# Patient Record
Sex: Female | Born: 1995 | Race: Black or African American | Hispanic: No | Marital: Single | Smoking: Never smoker
Health system: Southern US, Community
[De-identification: ages and names within clinical notes are randomized; demographics above are authoritative.]

## PROBLEM LIST (undated history)

## (undated) ENCOUNTER — Inpatient Hospital Stay: Payer: Self-pay

## (undated) DIAGNOSIS — W3400XA Accidental discharge from unspecified firearms or gun, initial encounter: Secondary | ICD-10-CM

## (undated) DIAGNOSIS — F419 Anxiety disorder, unspecified: Secondary | ICD-10-CM

## (undated) HISTORY — PX: OTHER SURGICAL HISTORY: SHX169

---
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
## 2015-07-07 ENCOUNTER — Encounter: Payer: Self-pay | Admitting: Family Medicine

## 2015-07-07 ENCOUNTER — Emergency Department
Admission: EM | Admit: 2015-07-07 | Discharge: 2015-07-07 | Disposition: A | Discharge status: Home / Self Care | Payer: Medicaid Other | Attending: Emergency Medicine | Admitting: Emergency Medicine

## 2015-07-07 DIAGNOSIS — Z3A00 Weeks of gestation of pregnancy not specified: Secondary | ICD-10-CM | POA: Insufficient documentation

## 2015-07-07 DIAGNOSIS — O9989 Other specified diseases and conditions complicating pregnancy, childbirth and the puerperium: Secondary | ICD-10-CM | POA: Diagnosis present

## 2015-07-07 DIAGNOSIS — H1132 Conjunctival hemorrhage, left eye: Secondary | ICD-10-CM | POA: Diagnosis not present

## 2015-07-07 DIAGNOSIS — J309 Allergic rhinitis, unspecified: Secondary | ICD-10-CM | POA: Diagnosis not present

## 2015-07-07 DIAGNOSIS — Z3401 Encounter for supervision of normal first pregnancy, first trimester: Secondary | ICD-10-CM

## 2015-07-07 DIAGNOSIS — O99519 Diseases of the respiratory system complicating pregnancy, unspecified trimester: Secondary | ICD-10-CM | POA: Insufficient documentation

## 2015-07-07 DIAGNOSIS — Z79899 Other long term (current) drug therapy: Secondary | ICD-10-CM | POA: Diagnosis not present

## 2015-07-07 DIAGNOSIS — J302 Other seasonal allergic rhinitis: Secondary | ICD-10-CM | POA: Diagnosis present

## 2015-07-07 LAB — POCT PREGNANCY, URINE: PREG TEST UR: POSITIVE — AB

## 2015-07-07 NOTE — ED Notes (Signed)
Left eye blood vessel rupture, denies pain at this time.

## 2015-07-07 NOTE — ED Provider Notes (Signed)
[facility] Emergency Department Provider Note ____________________________________________  Time seen: Approximately 4:56 PM  I have reviewed the triage vital signs and the nursing notes.   HISTORY  Chief Complaint Eye Problem   HPI Nancy Dixon is a 19 y.o. female who presents to emergency department for evaluation of left eye redness. She states that she noticed it last Monday or Tuesday. She denies a change in vision. She denies pain in the eye. She denies injury to the eye. She denies discharge. She denies recent URI. She states that she has had some issues with seasonal allergies. She is taking Zyrtec about every 2 weeks.  Patient is also requesting a pregnancy test. She has very irregular menses, however she states it's been 3-4 months since she's had a menstrual cycle.  History reviewed. No pertinent past medical history.  Patient Active Problem List   Diagnosis Date Noted  . Seasonal allergies 07/07/2015    History reviewed. No pertinent past surgical history.  Current Outpatient Rx  Name  Route  Sig  Dispense  Refill  . cetirizine (ZYRTEC) 10 MG tablet   Oral   Take 10 mg by mouth daily.           Allergies Review of patient's allergies indicates no known allergies.  History reviewed. No pertinent family history.  Social History Social History  Substance Use Topics  . Smoking status: Never Smoker   . Smokeless tobacco: Never Used  . Alcohol Use: No    Review of Systems   Constitutional: No fever/chills Eyes: Visual changes: no. ENT: No sore throat. Cardiovascular: Denies chest pain. Respiratory: Denies shortness of breath. Gastrointestinal: No abdominal pain.  No nausea, no vomiting.  No diarrhea.  No constipation. Musculoskeletal: Negative for pain. Skin: Negative for rash. Neurological: Negative for headaches, focal weakness or numbness. Psychiatric:At baseline, no complaint Lymphatic:Swollen nodes-- no Allergic:  Seasonal allergies: yes 10-point ROS otherwise negative.  ____________________________________________  PHYSICAL EXAM:  VITAL SIGNS: BP 131/65; HR 87; Temp: 97.8; Resp: 16; SpO2 97%. ED Triage Vitals  Enc Vitals Group     BP --      Pulse --      Resp --      Temp --      Temp src --      SpO2 --      Weight --      Height --      Head Cir --      Peak Flow --      Pain Score --      Pain Loc --      Pain Edu? --      Excl. in [place]? --     Constitutional: Alert and oriented. Well appearing and in no acute distress. Eyes: Visual acuity--see nursing documentation; No globe trauma; Eyelids normal to inspection; Everted for exam yes; Conjunctiva and sclera: subconjunctival hemorrhage noted to the 3 o'clock area of the left eye; Corneas: Normal; Examined with fluorescein no; EOM's intact; Pupils PERRLA; Anterior Chambers normal with limited exam.  Head: Atraumatic. Nose: No congestion/rhinnorhea. Mouth/Throat: Mucous membranes are moist.  Oropharynx non-erythematous. Neck: No stridor.  Cardiovascular: Normal rate, regular rhythm. Grossly normal heart sounds.  Good peripheral circulation. Respiratory: Normal respiratory effort.  No retractions. Gastrointestinal: Soft and nontender. No distention. No abdominal bruits. No CVA tenderness. Musculoskeletal:Normal ROM Neurologic:  Normal speech and language. No gross focal neurologic deficits are appreciated. Speech is normal. No gait instability. Skin:  Skin is warm, dry and  intact. No rash noted. Psychiatric: Mood and affect are normal. Speech and behavior are normal.  ____________________________________________   LABS (all labs ordered are listed, but only abnormal results are displayed)  Labs Reviewed  POCT PREGNANCY, URINE - Abnormal; Notable for the following:    Preg Test, Ur POSITIVE (*)    All other components within normal limits  POC URINE PREG, ED    ____________________________________________  EKG   ____________________________________________  RADIOLOGY   ____________________________________________   PROCEDURES  Procedure(s) performed:   ____________________________________________   INITIAL IMPRESSION / ASSESSMENT AND PLAN / ED COURSE  Pertinent labs & imaging results that were available during my care of the patient were reviewed by me and considered in my medical decision making (see chart for details).  Patient advised to schedule an appointment with an OB GYN.  Patient was advised to follow up with opthalmology for eye symptoms that are not improving over the next 14 days. She was  also advised to return to the ER for symptoms that change or worsen if unable to schedule an appointment.  ____________________________________________   FINAL CLINICAL IMPRESSION(S) / ED DIAGNOSES  Final diagnoses:  Subconjunctival bleed, left  Pregnancy, first, first trimester       Victorino Dike, FNP 07/07/15 1918  Daymon Larsen, MD 07/07/15 2006

## 2015-08-05 ENCOUNTER — Other Ambulatory Visit: Payer: Self-pay | Admitting: Physician Assistant

## 2015-08-05 DIAGNOSIS — Z3492 Encounter for supervision of normal pregnancy, unspecified, second trimester: Secondary | ICD-10-CM

## 2015-08-06 ENCOUNTER — Ambulatory Visit
Admission: RE | Admit: 2015-08-06 | Discharge: 2015-08-06 | Disposition: A | Discharge status: Home / Self Care | Payer: Medicaid Other | Source: Ambulatory Visit | Attending: Physician Assistant | Admitting: Physician Assistant

## 2015-08-06 DIAGNOSIS — Z3A29 29 weeks gestation of pregnancy: Secondary | ICD-10-CM | POA: Insufficient documentation

## 2015-08-06 DIAGNOSIS — Z36 Encounter for antenatal screening of mother: Secondary | ICD-10-CM | POA: Diagnosis present

## 2015-08-06 DIAGNOSIS — Z3492 Encounter for supervision of normal pregnancy, unspecified, second trimester: Secondary | ICD-10-CM

## 2015-08-27 ENCOUNTER — Inpatient Hospital Stay
Admission: RE | Admit: 2015-08-27 | Discharge: 2015-08-27 | Disposition: A | Discharge status: Home / Self Care | Payer: Medicaid Other | Attending: Obstetrics and Gynecology | Admitting: Obstetrics and Gynecology

## 2015-08-27 ENCOUNTER — Encounter: Payer: Self-pay | Admitting: *Deleted

## 2015-08-27 DIAGNOSIS — Z3A32 32 weeks gestation of pregnancy: Secondary | ICD-10-CM | POA: Diagnosis not present

## 2015-08-27 DIAGNOSIS — O26893 Other specified pregnancy related conditions, third trimester: Secondary | ICD-10-CM | POA: Diagnosis present

## 2015-08-27 NOTE — Procedures (Signed)
FETAL SURVEILLANCE TESTING SUMMARY  INDICATIONS: 1) supervision of high risk pregnancy, 3rd trimester 2) morbid obesity affecting pregnancy, 3rd trimester 3) BMI 45 - 49.89 4) [redacted] weeks gestational age  OBJECTIVE RESULTS: Fetal heart variability: moderate Fetal Heart Rate decelerations: none Fetal heart rate accelerations: present (multiple 15 x 15 bpm changes from baseline lasting > 15 seconds) Baseline fetal heart rate: 145 bpm Tocometry: occasional contractions, not regular or frequent  Fetal surveillance: reassuring  Prentice Docker, MD 08/27/2015 10:09 AM

## 2015-09-03 ENCOUNTER — Inpatient Hospital Stay
Admission: RE | Admit: 2015-09-03 | Discharge: 2015-09-03 | Disposition: A | Discharge status: Home / Self Care | Payer: Medicaid Other | Attending: Obstetrics and Gynecology | Admitting: Obstetrics and Gynecology

## 2015-09-03 DIAGNOSIS — Z3A33 33 weeks gestation of pregnancy: Secondary | ICD-10-CM | POA: Insufficient documentation

## 2015-09-03 DIAGNOSIS — E669 Obesity, unspecified: Secondary | ICD-10-CM | POA: Diagnosis present

## 2015-09-03 DIAGNOSIS — O99213 Obesity complicating pregnancy, third trimester: Secondary | ICD-10-CM | POA: Insufficient documentation

## 2015-09-03 NOTE — Progress Notes (Signed)
OB Triage Note  D/w patient that given BMI 51 that I believe that her care is better served at a tertiary care center and since she goes to [facility], I recommended [facility], which she is amenable to. Information relayed to HD and patient has PNV next week and was told to let them know that she desires switching her delivery site to [facility] rNST today  Durene Romans MD [place]  Pager: [phone]

## 2015-09-03 NOTE — Procedures (Signed)
Fetal Non Stress Test  Current Date/Time: 09/03/2015/10:02 AM Gestational Age: 19 1/7 FHT: baseline 150, positive accelerations to 170, no decelerations, moderate variability Toco: quiet  A/P: pt doing well Reactive NST. Continue current plan of care. Discussed with patient given her BMI recommendation of delivery at tertiary facility- [facility], [facility]   This patient and plan were discussed with Dr Ilda Basset 09/03/2015

## 2015-09-10 ENCOUNTER — Inpatient Hospital Stay
Admission: RE | Admit: 2015-09-10 | Discharge: 2015-09-10 | Disposition: A | Discharge status: Home / Self Care | Payer: Medicaid Other | Attending: Obstetrics and Gynecology | Admitting: Obstetrics and Gynecology

## 2015-09-10 DIAGNOSIS — O99213 Obesity complicating pregnancy, third trimester: Secondary | ICD-10-CM | POA: Insufficient documentation

## 2015-09-10 DIAGNOSIS — Z3A34 34 weeks gestation of pregnancy: Secondary | ICD-10-CM | POA: Insufficient documentation

## 2015-09-10 DIAGNOSIS — O0973 Supervision of high risk pregnancy due to social problems, third trimester: Secondary | ICD-10-CM | POA: Diagnosis not present

## 2015-09-10 DIAGNOSIS — Z6841 Body Mass Index (BMI) 40.0 and over, adult: Secondary | ICD-10-CM | POA: Diagnosis not present

## 2015-09-10 NOTE — Procedures (Deleted)
Fetal Non Stress Test  Current Date/Time: 09/10/2015/9:35 AM Gestational Age: [redacted]w[redacted]d FHT: 145 baseline, +accels, no decels, moderate variability Toco: quiet  A/P: pt doing well RNST. Continue with current plan of care. Per patient, next AP testing will done at [place] Pager: [phone]

## 2015-09-10 NOTE — Procedures (Signed)
Fetal Non Stress Test  Current Date/Time: 09/10/2015/9:35 AM Gestational Age: [redacted]w[redacted]d FHT: 145 baseline, +accels, no decels, moderate variability Toco: quiet  A/P: pt doing well RNST. Continue with current plan of care. Per patient, next AP testing will done at [place] Pager: [phone]

## 2015-12-01 ENCOUNTER — Emergency Department
Admission: EM | Admit: 2015-12-01 | Discharge: 2015-12-01 | Disposition: A | Discharge status: Home / Self Care | Payer: Medicaid Other | Attending: Emergency Medicine | Admitting: Emergency Medicine

## 2015-12-01 ENCOUNTER — Encounter: Payer: Self-pay | Admitting: *Deleted

## 2015-12-01 DIAGNOSIS — Z79899 Other long term (current) drug therapy: Secondary | ICD-10-CM | POA: Diagnosis not present

## 2015-12-01 DIAGNOSIS — Y999 Unspecified external cause status: Secondary | ICD-10-CM | POA: Insufficient documentation

## 2015-12-01 DIAGNOSIS — S01111A Laceration without foreign body of right eyelid and periocular area, initial encounter: Secondary | ICD-10-CM | POA: Diagnosis not present

## 2015-12-01 DIAGNOSIS — Y929 Unspecified place or not applicable: Secondary | ICD-10-CM | POA: Diagnosis not present

## 2015-12-01 DIAGNOSIS — S0083XA Contusion of other part of head, initial encounter: Secondary | ICD-10-CM

## 2015-12-01 DIAGNOSIS — Y939 Activity, unspecified: Secondary | ICD-10-CM | POA: Diagnosis not present

## 2015-12-01 DIAGNOSIS — S0181XA Laceration without foreign body of other part of head, initial encounter: Secondary | ICD-10-CM

## 2015-12-01 DIAGNOSIS — W228XXA Striking against or struck by other objects, initial encounter: Secondary | ICD-10-CM | POA: Insufficient documentation

## 2015-12-01 NOTE — ED Notes (Signed)
Pt with lac over right eye. Not bleeding at this time. Small amount eye lid swelling. Eye appears undamaged. Pt denies vision changes.

## 2015-12-01 NOTE — ED Notes (Signed)
Pt hit right side of eye on dresser, pt has small laceration , no bleeding noted

## 2015-12-01 NOTE — ED Provider Notes (Signed)
[facility] Emergency Department Provider Note ____________________________________________  Time seen: 19:30  I have reviewed the triage vital signs and the nursing notes.  HISTORY  Chief Complaint  Laceration  HPI Nancy Dixon is a 20 y.o. female who presents for a head laceration. The patient bent over and hit her head on the corner of a car door. The laceration is located under the right eyebrow. She denies loss of consciousness, dizziness, nausea, vomiting, lightheadedness, or altered mental status. She had a mild headache, but that has resolved. She applied pressure to stop the bleeding. She denies any significant pain related to her injury.  History reviewed. No pertinent past medical history.  Patient Active Problem List   Diagnosis Date Noted  . Seasonal allergies 07/07/2015    History reviewed. No pertinent past surgical history.  Current Outpatient Rx  Name  Route  Sig  Dispense  Refill  . cetirizine (ZYRTEC) 10 MG tablet   Oral   Take 10 mg by mouth daily.         . Prenatal Vit-Fe Fumarate-FA (MULTIVITAMIN-PRENATAL) 27-0.8 MG TABS tablet   Oral   Take 1 tablet by mouth daily at 12 noon.           Allergies Banana  No family history on file.  Social History Social History  Substance Use Topics  . Smoking status: Never Smoker   . Smokeless tobacco: Never Used  . Alcohol Use: No    Review of Systems  Constitutional: Negative for fever. Eyes: Negative for visual changes. Skin: Positive for laceration below right eyebrow. Neurological: Positive for headaches. Negative focal weakness or numbness. ____________________________________________  PHYSICAL EXAM:  VITAL SIGNS: ED Triage Vitals  Enc Vitals Group     BP 12/01/15 1845 124/86 mmHg     Pulse Rate 12/01/15 1845 89     Resp 12/01/15 1845 20     Temp 12/01/15 1845 97.3 F (36.3 C)     Temp Source 12/01/15 1845 Oral     SpO2 12/01/15 1845 98 %     Weight  12/01/15 1845 290 lb (131.543 kg)     Height 12/01/15 1845 5\' 3"  (1.6 m)     Head Cir --      Peak Flow --      Pain Score --      Pain Loc --      Pain Edu? --      Excl. in [place]? --     Constitutional: Alert and oriented. Well appearing and in no distress. Head: Normocephalic. Patient with a 1 cm laceration beneath the right eyebrow. No longer bleeding. Minimal swelling.  Eyes: Conjunctivae are normal. PERRL.  Neurologic:  Normal gait without ataxia. Normal speech and language. No gross focal neurologic deficits are appreciated. Skin:  Skin is warm, dry. No rash noted. Psychiatric: Mood and affect are normal. Patient exhibits appropriate insight and judgment. ____________________________________________  PROCEDURES  LACERATION REPAIR Performed by: Loni Muse PA-S Authorized by: Melvenia Needles Consent: Verbal consent obtained. Risks and benefits: risks, benefits and alternatives were discussed Consent given by: patient Patient identity confirmed: provided demographic data Wound explored  Laceration Location: Beneath right eyebrow  Laceration Length: 1 cm  No Foreign Bodies seen or palpated  Irrigation method: damp gauze   Amount of cleaning: standard  Skin closure: Wound adhesive  Patient tolerance: Patient tolerated the procedure well with no immediate complications. ____________________________________________  INITIAL IMPRESSION / ASSESSMENT AND PLAN / ED COURSE  Patient with  1 cm laceration beneath right eyebrow. Wound adhesive applied. Patient was advised to not apply lotion, ointment, or topical antibiotics to the area. She will follow up with her PCP as scheduled in one week. She will return to the ED if she experiences any neurological symptoms.  ____________________________________________  FINAL CLINICAL IMPRESSION(S) / ED DIAGNOSES  Final diagnoses:  Facial laceration, initial encounter  Facial contusion, initial encounter     Melvenia Needles, PA-C 12/01/15 2044  Ponciano Ort, MD 12/02/15 0030

## 2015-12-01 NOTE — Discharge Instructions (Signed)
Facial Laceration A facial laceration is a cut on the face. These injuries can be painful and cause bleeding. Some cuts may need to be closed with stitches (sutures), skin adhesive strips, or wound glue. Cuts usually heal quickly but can leave a scar. It can take 1-2 years for the scar to go away completely. HOME CARE   Only take medicines as told by your doctor.  Follow your doctor's instructions for wound care. For Stitches:  Keep the cut clean and dry.  If you have a bandage (dressing), change it at least once a day. Change the bandage if it gets wet or dirty, or as told by your doctor.  Wash the cut with soap and water 2 times a day. Rinse the cut with water. Pat it dry with a clean towel.  Put a thin layer of medicated cream on the cut as told by your doctor.  You may shower after the first 24 hours. Do not soak the cut in water until the stitches are removed.  Have your stitches removed as told by your doctor.  Do not wear any makeup until a few days after your stitches are removed. For Skin Adhesive Strips:  Keep the cut clean and dry.  Do not get the strips wet. You may take a bath, but be careful to keep the cut dry.  If the cut gets wet, pat it dry with a clean towel.  The strips will fall off on their own. Do not remove the strips that are still stuck to the cut. For Wound Glue:  You may shower or take baths. Do not soak or scrub the cut. Do not swim. Avoid heavy sweating until the glue falls off on its own. After a shower or bath, pat the cut dry with a clean towel.  Do not put medicine or makeup on your cut until the glue falls off.  If you have a bandage, do not put tape over the glue.  Avoid lots of sunlight or tanning lamps until the glue falls off.  The glue will fall off on its own in 5-10 days. Do not pick at the glue. After Healing:  Put sunscreen on the cut for the first year to reduce your scar. GET HELP IF:  You have a fever. GET HELP RIGHT AWAY  IF:   Your cut area gets red, painful, or puffy (swollen).  You see a yellowish-white fluid (pus) coming from the cut.   This information is not intended to replace advice given to you by your health care provider. Make sure you discuss any questions you have with your health care provider.   Document Released: 02/21/2008 Document Revised: 09/25/2014 Document Reviewed: 04/17/2013 Elsevier Interactive Patient Education 2016 [place], or Adhesive Wound Closure Doctors use stitches (sutures), staples, and certain glue (skin adhesives) to hold your skin together while it heals (wound closure). You may need this treatment after you have surgery or if you cut your skin accidentally. These methods help your skin heal more quickly. They also make it less likely that you will have a scar. WHAT ARE THE DIFFERENT KINDS OF WOUND CLOSURES? There are many options for wound closure. The one that your doctor uses depends on how deep and large your wound is. Adhesive Glue To use this glue to close a wound, your doctor holds the edges of the wound together and paints the glue on the surface of your skin. You may need more than one layer  of glue. Then the wound may be covered with a light bandage (dressing). This type of skin closure may be used for small wounds that are not deep (superficial). Using glue for wound closure is less painful than other methods. It does not require a medicine that numbs the area. This method also leaves nothing to be removed. Adhesive glue is often used for children and on facial wounds. Adhesive glue cannot be used for wounds that are deep, uneven, or bleeding. It is not used inside of a wound.  Adhesive Strips These strips are made of sticky (adhesive), porous paper. They are placed across your skin edges like a regular adhesive bandage. You leave them on until they fall off. Adhesive strips may be used to close very superficial wounds. They may also be  used along with sutures to improve closure of your skin edges.  Sutures Sutures are the oldest method of wound closure. Sutures can be made from natural or synthetic materials. They can be made from a material that your body can break down as your wound heals (absorbable), or they can be made from a material that needs to be removed from your skin (nonabsorbable). They come in many different strengths and sizes. Your doctor attaches the sutures to a steel needle on one end. Sutures can be passed through your skin, or through the tissues beneath your skin. Then they are tied and cut. Your skin edges may be closed in one continuous stitch or in separate stitches. Sutures are strong and can be used for all kinds of wounds. Absorbable sutures may be used to close tissues under the skin. The disadvantage of sutures is that they may cause skin reactions that lead to infection. Nonabsorbable sutures need to be removed. Staples When surgical staples are used to close a wound, the edges of your skin on both sides of the wound are brought close together. A staple is placed across the wound, and an instrument secures the edges together. Staples are often used to close surgical cuts (incisions). Staples are faster to use than sutures, and they cause less reaction from your skin. Staples need to be removed using a tool that bends the staples away from your skin. HOW DO I CARE FOR MY WOUND CLOSURE?  Take medicines only as told by your doctor.  If you were prescribed an antibiotic medicine for your wound, finish it all even if you start to feel better.  Use ointments or creams only as told by your doctor.  Wash your hands with soap and water before and after touching your wound.  Do not soak your wound in water. Do not take baths, swim, or use a hot tub until your doctor says it is okay.  Ask your doctor when you can start showering. Cover your wound if told by your doctor.  Do not take out your own sutures  or staples.  Do not pick at your wound. Picking can cause an infection.  Keep all follow-up visits as told by your doctor. This is important. HOW LONG WILL I HAVE MY WOUND CLOSURE?   Leave adhesive glue on your skin until the glue peels away.  Leave adhesive strips on your skin until they fall off.  Absorbable sutures will dissolve within several days.  Nonabsorbable sutures and staples must be removed. The location of the wound will determine how long they stay in. This can range from several days to a couple of weeks. WHEN SHOULD I SEEK HELP FOR MY WOUND CLOSURE?  Contact your doctor if:  You have a fever.  You have chills.  You have redness, puffiness (swelling), or pain at the site of your wound.  You have fluid, blood, or pus coming from your wound.  There is a bad smell coming from your wound.  The skin edges of your wound start to separate after your sutures have been removed.  Your wound becomes thick, raised, and darker in color after your sutures come out (scarring).   This information is not intended to replace advice given to you by your health care provider. Make sure you discuss any questions you have with your health care provider.   Document Released: 07/02/2009 Document Revised: 09/25/2014 Document Reviewed: 02/11/2014 Elsevier Interactive Patient Education 2016 [place] not apply lotion or ointment to the glued area, as this will cause the material to breakdown. Follow up with your PCP as scheduled. Return to the ED if you experience worsening of symptoms or neurological symptoms, such as dizziness, altered mental status, or loss of consciousness.

## 2016-06-27 DIAGNOSIS — S45102A Unspecified injury of brachial artery, left side, initial encounter: Secondary | ICD-10-CM | POA: Insufficient documentation

## 2016-07-24 ENCOUNTER — Emergency Department
Admission: EM | Admit: 2016-07-24 | Discharge: 2016-07-24 | Disposition: A | Discharge status: Home / Self Care | Payer: Medicaid Other | Attending: Emergency Medicine | Admitting: Emergency Medicine

## 2016-07-24 DIAGNOSIS — T43615A Adverse effect of caffeine, initial encounter: Secondary | ICD-10-CM | POA: Diagnosis not present

## 2016-07-24 DIAGNOSIS — T887XXA Unspecified adverse effect of drug or medicament, initial encounter: Secondary | ICD-10-CM | POA: Diagnosis not present

## 2016-07-24 DIAGNOSIS — R450 Nervousness: Secondary | ICD-10-CM | POA: Diagnosis present

## 2016-07-24 DIAGNOSIS — Y828 Other medical devices associated with adverse incidents: Secondary | ICD-10-CM | POA: Insufficient documentation

## 2016-07-24 NOTE — ED Triage Notes (Signed)
Patient presents to the ED with feeling "jittery" and nauseous after drinking two cups of coffee today at [facility].  Patient states, "I never drink coffee."  Patient is in no obvious distress at this time.  Patient denies abdominal pain.

## 2016-07-24 NOTE — ED Provider Notes (Signed)
[facility] Emergency Department Provider Note  ____________________________________________  Time seen: Approximately 5:49 PM  I have reviewed the triage vital signs and the nursing notes.   HISTORY  Chief Complaint "jittery"    HPI Nancy Dixon is a 20 y.o. female [facility] emergency department complaining of feeling jittery. Patient states that at lunch today she drank approximately 32 ounces of coffee. She states that she never drinks any caffeinated beverages. Initially, patient states that she had no symptoms. However she started feeling "jittery" "shaky, lightheaded, and had one episode of looser stools. Patient denies any chest pain, shortness of breath, palpitations. Patient states that symptoms are improving somewhat. Patient denies any other complaints at this time. No significant medical history.   History reviewed. No pertinent past medical history.  Patient Active Problem List   Diagnosis Date Noted  . Seasonal allergies 07/07/2015    History reviewed. No pertinent surgical history.  Prior to Admission medications   Medication Sig Start Date End Date Taking? Authorizing Provider  cetirizine (ZYRTEC) 10 MG tablet Take 10 mg by mouth daily.    Historical Provider, MD  Prenatal Vit-Fe Fumarate-FA (MULTIVITAMIN-PRENATAL) 27-0.8 MG TABS tablet Take 1 tablet by mouth daily at 12 noon.    Historical Provider, MD    Allergies Banana  No family history on file.  Social History Social History  Substance Use Topics  . Smoking status: Never Smoker  . Smokeless tobacco: Never Used  . Alcohol use No     Review of Systems  Constitutional: No fever/chills. Positive for feeling "jittery" Eyes: No visual changes.  ENT: No upper respiratory complaints. Cardiovascular: no chest pain. Respiratory: no cough. No SOB. Gastrointestinal: No abdominal pain.  No nausea, no vomiting.  No diarrhea but positive for looser stools..  No  constipation. Musculoskeletal: Negative for musculoskeletal pain. Skin: Negative for rash, abrasions, lacerations, ecchymosis. Neurological: Negative for headaches, focal weakness or numbness. 10-point ROS otherwise negative.  ____________________________________________   PHYSICAL EXAM:  VITAL SIGNS: ED Triage Vitals  Enc Vitals Group     BP 07/24/16 1702 123/82     Pulse Rate 07/24/16 1702 89     Resp 07/24/16 1702 18     Temp 07/24/16 1702 98.2 F (36.8 C)     Temp Source 07/24/16 1702 Oral     SpO2 07/24/16 1702 99 %     Weight 07/24/16 1702 276 lb (125.2 kg)     Height 07/24/16 1702 5\' 2"  (1.575 m)     Head Circumference --      Peak Flow --      Pain Score 07/24/16 1724 0     Pain Loc --      Pain Edu? --      Excl. in [place]? --      Constitutional: Alert and oriented. Well appearing and in no acute distress. Eyes: Conjunctivae are normal. PERRL. EOMI. Head: Atraumatic. Neck: No stridor.    Cardiovascular: Normal rate, regular rhythm. Normal S1 and S2.  Good peripheral circulation. Respiratory: Normal respiratory effort without tachypnea or retractions. Lungs CTAB. Good air entry to the bases with no decreased or absent breath sounds. Musculoskeletal: Full range of motion to all extremities. No gross deformities appreciated. Neurologic:  Normal speech and language. No gross focal neurologic deficits are appreciated.  Skin:  Skin is warm, dry and intact. No rash noted. Psychiatric: Mood and affect are normal. Speech and behavior are normal. Patient exhibits appropriate insight and judgement.   ____________________________________________   LABS (  all labs ordered are listed, but only abnormal results are displayed)  Labs Reviewed - No data to display ____________________________________________  EKG   ____________________________________________  RADIOLOGY   No results found.  ____________________________________________    PROCEDURES  Procedure(s)  performed:    Procedures    Medications - No data to display   ____________________________________________   INITIAL IMPRESSION / ASSESSMENT AND PLAN / ED COURSE  Pertinent labs & imaging results that were available during my care of the patient were reviewed by me and considered in my medical decision making (see chart for details).  Review of the [place] CSRS was performed in accordance of the [place] prior to dispensing any controlled drugs.  Clinical Course     Patient's diagnosis is consistent with Side effects of caffeine use. Patient reports never drinking caffeine and had approximately 32 ounces of coffee within a short duration. Patient's symptoms are consistent with caffeine use. Exam is reassuring. Patient reports that symptoms are improving at this time..  Patient is given ED precautions to return to the ED for any worsening or new symptoms.     ____________________________________________  FINAL CLINICAL IMPRESSION(S) / ED DIAGNOSES  Final diagnoses:  Adverse effect of caffeine, initial encounter      NEW MEDICATIONS STARTED DURING THIS VISIT:  Discharge Medication List as of 07/24/2016  6:49 PM          This chart was dictated using voice recognition software/Dragon. Despite best efforts to proofread, errors can occur which can change the meaning. Any change was purely unintentional.    Darletta Moll, PA-C 07/24/16 1907    Lisa Roca, MD 07/27/16 1024

## 2016-07-24 NOTE — ED Notes (Signed)
Pt reports she was eating dinner and began to feel sick approx one hour later - pt became "jittery", cold/clammy, shaky, "swimmy headed" and  loose stools - pt mother checked her sugar and it was 150 - pt reports that at this time she is feeling fine

## 2016-09-27 DIAGNOSIS — E669 Obesity, unspecified: Secondary | ICD-10-CM | POA: Insufficient documentation

## 2016-10-18 ENCOUNTER — Emergency Department
Admission: EM | Admit: 2016-10-18 | Discharge: 2016-10-18 | Disposition: A | Discharge status: Home / Self Care | Payer: Medicaid Other | Attending: Emergency Medicine | Admitting: Emergency Medicine

## 2016-10-18 ENCOUNTER — Encounter: Payer: Self-pay | Admitting: *Deleted

## 2016-10-18 DIAGNOSIS — F419 Anxiety disorder, unspecified: Secondary | ICD-10-CM | POA: Insufficient documentation

## 2016-10-18 DIAGNOSIS — F410 Panic disorder [episodic paroxysmal anxiety] without agoraphobia: Secondary | ICD-10-CM
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
## 2016-10-18 NOTE — ED Triage Notes (Addendum)
States hx of anxiety attack, states she was at work and she had a panic attack and felt her heart beating fast, at present pt awake and alert, states feeling better at present, states she has taken her mother medication previously when she had an axiety attack

## 2016-10-18 NOTE — Discharge Instructions (Signed)
Please follow-up with your doctor. Please also call your surgeon. But them know about the pain your having when you usually arm a lot. I think you're having what is basically claudication of the arm in other words the muscles or not getting enough blood flow when you're using it a lot and they start to ache and then get better when he rests. He can avoid this by not using the arm a lot or possibly the surgeons might be able to open the narrowed area that they told you that you had. At the present time I don't think there is anything to worry about. If she continued to have more panic attacks her doctor may be out help you but I think just knowing what it is may prevent any future attacks.

## 2016-11-30 ENCOUNTER — Emergency Department
Admission: EM | Admit: 2016-11-30 | Discharge: 2016-11-30 | Disposition: A | Discharge status: Home / Self Care | Payer: Medicaid Other | Attending: Emergency Medicine | Admitting: Emergency Medicine

## 2016-11-30 ENCOUNTER — Emergency Department: Payer: Medicaid Other

## 2016-11-30 ENCOUNTER — Encounter: Payer: Self-pay | Admitting: Emergency Medicine

## 2016-11-30 DIAGNOSIS — R0602 Shortness of breath: Secondary | ICD-10-CM | POA: Insufficient documentation

## 2016-11-30 DIAGNOSIS — F419 Anxiety disorder, unspecified: Secondary | ICD-10-CM | POA: Diagnosis not present

## 2016-11-30 DIAGNOSIS — R0789 Other chest pain: Secondary | ICD-10-CM | POA: Diagnosis present

## 2016-11-30 HISTORY — DX: Accidental discharge from unspecified firearms or gun, initial encounter: W34.00XA

## 2016-11-30 LAB — BASIC METABOLIC PANEL
Anion gap: 7 (ref 5–15)
BUN: 12 mg/dL (ref 6–20)
CHLORIDE: 106 mmol/L (ref 101–111)
CO2: 23 mmol/L (ref 22–32)
Calcium: 9.2 mg/dL (ref 8.9–10.3)
Creatinine, Ser: 0.79 mg/dL (ref 0.44–1.00)
GFR calc Af Amer: >60 mL/min (ref >60)
GFR calc non Af Amer: >60 mL/min (ref >60)
GLUCOSE: 134 mg/dL — AB (ref 65–99)
POTASSIUM: 3.2 mmol/L — AB (ref 3.5–5.1)
Sodium: 136 mmol/L (ref 135–145)

## 2016-11-30 LAB — CBC
HEMATOCRIT: 37.4 % (ref 35.0–47.0)
Hemoglobin: 12.5 g/dL (ref 12.0–16.0)
MCH: 28.2 pg (ref 26.0–34.0)
MCHC: 33.4 g/dL (ref 32.0–36.0)
MCV: 84.6 fL (ref 80.0–100.0)
Platelets: 478 10*3/uL — ABNORMAL HIGH (ref 150–440)
RBC: 4.42 MIL/uL (ref 3.80–5.20)
RDW: 16.3 % — ABNORMAL HIGH (ref 11.5–14.5)
WBC: 10.6 10*3/uL (ref 3.6–11.0)

## 2016-11-30 LAB — TROPONIN I: Troponin I: <0.03 ng/mL (ref <0.03)

## 2016-11-30 MED ORDER — IIOPAMIDOL (ISOVUE-250) INJECTION 51%: 100.0000 mL | Freq: Once | INTRAVENOUS | Status: DC | PRN

## 2016-11-30 MED ORDER — IOPAMIDOL (ISOVUE-370) INJECTION 76%
100.0000 mL | Freq: Once | INTRAVENOUS | Status: AC | PRN
  Administered 2016-11-30: 100 mL via INTRAVENOUS

## 2016-11-30 MED ORDER — SODIUM CHLORIDE 0.9 % IV BOLUS (SEPSIS)
1000.0000 mL | Freq: Once | INTRAVENOUS | Status: AC
  Administered 2016-11-30: 1000 mL via INTRAVENOUS

## 2016-11-30 NOTE — Discharge Instructions (Signed)
Please continue taking your escitalopram as prescribed. It is normal for her to take up to 6 weeks for this medication to start working. Please follow-up with the primary care physician as needed. Return to the emergency department for any new or worsening symptoms.  It was a pleasure to take care of you today, and thank you for coming to our emergency department.  If you have any questions or concerns before leaving please ask the nurse to grab me and I'm more than happy to go through your aftercare instructions again.  If you were prescribed any opioid pain medication today such as Norco, Vicodin, Percocet, morphine, hydrocodone, or oxycodone please make sure you do not drive when you are taking this medication as it can alter your ability to drive safely.  If you have any concerns once you are home that you are not improving or are in fact getting worse before you can make it to your follow-up appointment, please do not hesitate to call 911 and come back for further evaluation.  Darel Hong MD  Results for orders placed or performed during the hospital encounter of 75/79/72  Basic metabolic panel  Result Value Ref Range   Sodium 136 135 - 145 mmol/L   Potassium 3.2 (L) 3.5 - 5.1 mmol/L   Chloride 106 101 - 111 mmol/L   CO2 23 22 - 32 mmol/L   Glucose, Bld 134 (H) 65 - 99 mg/dL   BUN 12 6 - 20 mg/dL   Creatinine, Ser 0.79 0.44 - 1.00 mg/dL   Calcium 9.2 8.9 - 10.3 mg/dL   GFR calc non Af Amer >60 >60 mL/min   GFR calc Af Amer >60 >60 mL/min   Anion gap 7 5 - 15  CBC  Result Value Ref Range   WBC 10.6 3.6 - 11.0 K/uL   RBC 4.42 3.80 - 5.20 MIL/uL   Hemoglobin 12.5 12.0 - 16.0 g/dL   HCT 37.4 35.0 - 47.0 %   MCV 84.6 80.0 - 100.0 fL   MCH 28.2 26.0 - 34.0 pg   MCHC 33.4 32.0 - 36.0 g/dL   RDW 16.3 (H) 11.5 - 14.5 %   Platelets 478 (H) 150 - 440 K/uL  Troponin I  Result Value Ref Range   Troponin I <0.03 <0.03 ng/mL   Dg Chest 2 View  Result Date: 11/30/2016 CLINICAL DATA:   Palpitations, chest tightness and shortness of breath beginning at 1 a.m. EXAM: CHEST  2 VIEW COMPARISON:  None. FINDINGS: Cardiomediastinal silhouette is normal. No pleural effusions or focal consolidations. Trachea projects midline and there is no pneumothorax. Soft tissue planes and included osseous structures are non-suspicious. Large body habitus. IMPRESSION: Normal chest. Electronically Signed   By: Elon Alas M.D.   On: 11/30/2016 02:50   Ct Angio Chest Pe W/cm &/or Wo Cm  Result Date: 11/30/2016 CLINICAL DATA:  Generalized chest tightness, intermittent shortness of breath for few hours. Assess for pulmonary embolism. EXAM: CT ANGIOGRAPHY CHEST WITH CONTRAST TECHNIQUE: Multidetector CT imaging of the chest was performed using the standard protocol during bolus administration of intravenous contrast. Multiplanar CT image reconstructions and MIPs were obtained to evaluate the vascular anatomy. CONTRAST:  100 cc Isovue 370 COMPARISON:  None. FINDINGS: Large body habitus results in overall noisy image quality. CARDIOVASCULAR: Adequate contrast opacification of the pulmonary artery's. Main pulmonary artery is not enlarged. No pulmonary arterial filling defects to the level of the subsegmental branches. Heart size is normal, no right heart strain. No pericardial effusion.  Thoracic aorta is normal course and caliber, unremarkable. MEDIASTINUM/NODES: No lymphadenopathy by CT size criteria. Residual thymic tissue present. LUNGS/PLEURA: Tracheobronchial tree is patent, no pneumothorax. No pleural effusions, focal consolidations, pulmonary nodules or masses. UPPER ABDOMEN: Included view of the abdomen is unremarkable. MUSCULOSKELETAL: Visualized soft tissues and included osseous structures appear normal. Review of the MIP images confirms the above findings. IMPRESSION: Negative CTA  chest. Electronically Signed   By: Elon Alas M.D.   On: 11/30/2016 06:09

## 2016-11-30 NOTE — ED Notes (Signed)
Patient transported to CT 

## 2016-11-30 NOTE — ED Notes (Signed)
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
## 2016-11-30 NOTE — ED Triage Notes (Signed)
Pt presents to ED with generalized chest tightness with sob intermiettently for the past couple of hours. Pt alert and appears anxious. Pt states she has a hx of the same. Concerned it may be anxiety. Last seen by Juanda Crumble drew for the same and was told it was likely anxiety related.
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
## 2017-09-03 ENCOUNTER — Encounter: Payer: Self-pay | Admitting: Emergency Medicine

## 2017-09-03 ENCOUNTER — Emergency Department
Admission: EM | Admit: 2017-09-03 | Discharge: 2017-09-03 | Disposition: A | Discharge status: Home / Self Care | Payer: Medicaid Other | Attending: Emergency Medicine | Admitting: Emergency Medicine

## 2017-09-03 DIAGNOSIS — F411 Generalized anxiety disorder: Secondary | ICD-10-CM

## 2017-09-03 DIAGNOSIS — Z87891 Personal history of nicotine dependence: Secondary | ICD-10-CM | POA: Diagnosis not present

## 2017-09-03 DIAGNOSIS — F410 Panic disorder [episodic paroxysmal anxiety] without agoraphobia: Secondary | ICD-10-CM | POA: Diagnosis not present

## 2017-09-03 DIAGNOSIS — F418 Other specified anxiety disorders: Secondary | ICD-10-CM | POA: Diagnosis present

## 2017-09-03 DIAGNOSIS — Z79899 Other long term (current) drug therapy: Secondary | ICD-10-CM | POA: Insufficient documentation

## 2017-09-03 HISTORY — DX: Anxiety disorder, unspecified: F41.9

## 2017-09-03 LAB — CBC
HCT: 38.5 % (ref 35.0–47.0)
Hemoglobin: 12.9 g/dL (ref 12.0–16.0)
MCH: 29.9 pg (ref 26.0–34.0)
MCHC: 33.5 g/dL (ref 32.0–36.0)
MCV: 89.4 fL (ref 80.0–100.0)
PLATELETS: 441 10*3/uL — AB (ref 150–440)
RBC: 4.3 MIL/uL (ref 3.80–5.20)
RDW: 13.8 % (ref 11.5–14.5)
WBC: 11 10*3/uL (ref 3.6–11.0)

## 2017-09-03 LAB — BASIC METABOLIC PANEL
ANION GAP: 11 (ref 5–15)
BUN: 12 mg/dL (ref 6–20)
CALCIUM: 9.1 mg/dL (ref 8.9–10.3)
CHLORIDE: 104 mmol/L (ref 101–111)
CO2: 20 mmol/L — ABNORMAL LOW (ref 22–32)
CREATININE: 0.84 mg/dL (ref 0.44–1.00)
GFR calc non Af Amer: >60 mL/min (ref >60)
Glucose, Bld: 146 mg/dL — ABNORMAL HIGH (ref 65–99)
Potassium: 3.2 mmol/L — ABNORMAL LOW (ref 3.5–5.1)
Sodium: 135 mmol/L (ref 135–145)

## 2017-09-03 LAB — TROPONIN I

## 2017-09-03 MED ORDER — ESCITALOPRAM OXALATE 10 MG PO TABS: 10.0000 mg | ORAL_TABLET | Freq: Every day | ORAL | 0 refills | Status: DC
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
## 2017-09-03 NOTE — ED Triage Notes (Signed)
Pt states history of anxiety. Pt states she began to feel "like my heart was racing and I was gonna pass out" about 30 min pta. Pt appears anxious in triage. Pt provided with emotional reassurance in triage with noted reduction in heartrate.

## 2017-09-03 NOTE — Discharge Instructions (Signed)
Please restart your Lexapro and keep your appointment to follow-up with your primary care in 2 days as scheduled.  Return to the emergency department sooner for any concerns.  It was a pleasure to take care of you today, and thank you for coming to our emergency department.  If you have any questions or concerns before leaving please ask the nurse to grab me and I'm more than happy to go through your aftercare instructions again.  If you were prescribed any opioid pain medication today such as Norco, Vicodin, Percocet, morphine, hydrocodone, or oxycodone please make sure you do not drive when you are taking this medication as it can alter your ability to drive safely.  If you have any concerns once you are home that you are not improving or are in fact getting worse before you can make it to your follow-up appointment, please do not hesitate to call 911 and come back for further evaluation.  Darel Hong, MD  Results for orders placed or performed during the hospital encounter of 37/10/62  Basic metabolic panel  Result Value Ref Range   Sodium 135 135 - 145 mmol/L   Potassium 3.2 (L) 3.5 - 5.1 mmol/L   Chloride 104 101 - 111 mmol/L   CO2 20 (L) 22 - 32 mmol/L   Glucose, Bld 146 (H) 65 - 99 mg/dL   BUN 12 6 - 20 mg/dL   Creatinine, Ser 0.84 0.44 - 1.00 mg/dL   Calcium 9.1 8.9 - 10.3 mg/dL   GFR calc non Af Amer >60 >60 mL/min   GFR calc Af Amer >60 >60 mL/min   Anion gap 11 5 - 15  CBC  Result Value Ref Range   WBC 11.0 3.6 - 11.0 K/uL   RBC 4.30 3.80 - 5.20 MIL/uL   Hemoglobin 12.9 12.0 - 16.0 g/dL   HCT 38.5 35.0 - 47.0 %   MCV 89.4 80.0 - 100.0 fL   MCH 29.9 26.0 - 34.0 pg   MCHC 33.5 32.0 - 36.0 g/dL   RDW 13.8 11.5 - 14.5 %   Platelets 441 (H) 150 - 440 K/uL  Troponin I  Result Value Ref Range   Troponin I <0.03 <0.03 ng/mL

## 2017-09-03 NOTE — ED Notes (Signed)
Pt ambulatory with steady gait to stat registration to see if her lab work has resulted; understands unable to provide results at this time; pt says she's just anxious; back to wheelchair where she was sitting;

## 2017-09-09 ENCOUNTER — Encounter: Payer: Self-pay | Admitting: *Deleted

## 2017-09-09 ENCOUNTER — Other Ambulatory Visit: Payer: Self-pay

## 2017-09-09 DIAGNOSIS — F410 Panic disorder [episodic paroxysmal anxiety] without agoraphobia: Secondary | ICD-10-CM | POA: Insufficient documentation

## 2017-09-09 DIAGNOSIS — Z79899 Other long term (current) drug therapy: Secondary | ICD-10-CM | POA: Diagnosis not present

## 2017-09-09 DIAGNOSIS — Z87891 Personal history of nicotine dependence: Secondary | ICD-10-CM | POA: Diagnosis not present

## 2017-09-09 DIAGNOSIS — F419 Anxiety disorder, unspecified: Secondary | ICD-10-CM | POA: Insufficient documentation

## 2017-09-09 DIAGNOSIS — R000 Tachycardia, unspecified: Secondary | ICD-10-CM | POA: Insufficient documentation

## 2017-09-09 MED ORDER — ALBUTEROL SULFATE (2.5 MG/3ML) 0.083% IN NEBU: 5.0000 mg | INHALATION_SOLUTION | Freq: Once | RESPIRATORY_TRACT | Status: DC

## 2017-09-09 NOTE — ED Triage Notes (Signed)
Pt presents w/ c/o rapid heart rate. Pt states she got bad news from a friend who "died from an anxiety attack". Pt recently began meds for anxiety. Pt ambulatory to stat desk and in no acute distress at this time. Heart rate at initial evaluation tachycardiac, but now is more normative.

## 2017-09-10 ENCOUNTER — Emergency Department: Payer: Medicaid Other

## 2017-09-10 ENCOUNTER — Emergency Department
Admission: EM | Admit: 2017-09-10 | Discharge: 2017-09-10 | Disposition: A | Discharge status: Home / Self Care | Payer: Medicaid Other | Attending: Emergency Medicine | Admitting: Emergency Medicine

## 2017-09-10 DIAGNOSIS — F410 Panic disorder [episodic paroxysmal anxiety] without agoraphobia: Secondary | ICD-10-CM

## 2017-09-10 DIAGNOSIS — F419 Anxiety disorder, unspecified: Secondary | ICD-10-CM

## 2017-09-10 DIAGNOSIS — R000 Tachycardia, unspecified: Secondary | ICD-10-CM

## 2017-09-10 MED ORDER — LORAZEPAM 1 MG PO TABS
1.0000 mg | ORAL_TABLET | Freq: Once | ORAL | Status: AC
  Administered 2017-09-10: 1 mg via ORAL
  Filled 2017-09-10: qty 1
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
## 2017-09-10 NOTE — ED Notes (Signed)
Answering pt's call bell; needs to void; unhooked for pt to ambulate to bathroom; pt says the monitor for her heart is making her more anxious; requesting medicine for same; MD aware and placing order;

## 2017-09-10 NOTE — Discharge Instructions (Addendum)
You have been seen in the Emergency Department (ED) today for rapid heart rate, chest discomfort, and shortness of breath.  As we have discussed, we feel it is likely that panic attacks may be causing your symptoms.  Please follow up with the recommended doctor as instructed above in these documents regarding today?s emergent visit and your recent symptoms to discuss further management.  Continue to take your regular medications. Return to the Emergency Department (ED) if you experience any further chest pain/pressure/tightness, difficulty breathing, or sudden sweating, or other symptoms that concern you.

## 2017-09-15 ENCOUNTER — Encounter: Payer: Self-pay | Admitting: Emergency Medicine

## 2017-09-15 ENCOUNTER — Other Ambulatory Visit: Payer: Self-pay

## 2017-09-15 ENCOUNTER — Emergency Department
Admission: EM | Admit: 2017-09-15 | Discharge: 2017-09-15 | Disposition: A | Discharge status: Home / Self Care | Payer: Medicaid Other | Attending: Emergency Medicine | Admitting: Emergency Medicine

## 2017-09-15 DIAGNOSIS — Z87891 Personal history of nicotine dependence: Secondary | ICD-10-CM | POA: Diagnosis not present

## 2017-09-15 DIAGNOSIS — Z79899 Other long term (current) drug therapy: Secondary | ICD-10-CM | POA: Insufficient documentation

## 2017-09-15 DIAGNOSIS — F419 Anxiety disorder, unspecified: Secondary | ICD-10-CM | POA: Insufficient documentation

## 2017-09-15 MED ORDER — HYDROXYZINE HCL 25 MG PO TABS: 25.0000 mg | ORAL_TABLET | Freq: Three times a day (TID) | ORAL | 0 refills | Status: DC | PRN

## 2017-09-15 MED ORDER — HYDROXYZINE HCL 25 MG PO TABS
25.0000 mg | ORAL_TABLET | Freq: Once | ORAL | Status: AC
  Administered 2017-09-15: 25 mg via ORAL
  Filled 2017-09-15: qty 1
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
## 2017-09-15 NOTE — ED Triage Notes (Signed)
Pt presents ambulatory to triage with c/o anxiety and her "mouth being numb". Pt has no neurological deficits at this time and is NIH 0 by this RN. Pt Googled mouth numbness and is concerned that she is having a stroke or heart attack via [employer]. Pt is in NAD.

## 2017-09-24 ENCOUNTER — Emergency Department
Admission: EM | Admit: 2017-09-24 | Discharge: 2017-09-24 | Disposition: A | Discharge status: Home / Self Care | Payer: Medicaid Other | Attending: Emergency Medicine | Admitting: Emergency Medicine

## 2017-09-24 ENCOUNTER — Other Ambulatory Visit: Payer: Self-pay

## 2017-09-24 ENCOUNTER — Emergency Department: Payer: Medicaid Other

## 2017-09-24 DIAGNOSIS — R079 Chest pain, unspecified: Secondary | ICD-10-CM | POA: Diagnosis present

## 2017-09-24 DIAGNOSIS — F410 Panic disorder [episodic paroxysmal anxiety] without agoraphobia: Secondary | ICD-10-CM | POA: Insufficient documentation

## 2017-09-24 DIAGNOSIS — Z79899 Other long term (current) drug therapy: Secondary | ICD-10-CM | POA: Insufficient documentation

## 2017-09-24 DIAGNOSIS — Z87891 Personal history of nicotine dependence: Secondary | ICD-10-CM | POA: Insufficient documentation

## 2017-09-24 LAB — BASIC METABOLIC PANEL
Anion gap: 10 (ref 5–15)
BUN: 9 mg/dL (ref 6–20)
CALCIUM: 9 mg/dL (ref 8.9–10.3)
CO2: 21 mmol/L — ABNORMAL LOW (ref 22–32)
CREATININE: 0.82 mg/dL (ref 0.44–1.00)
Chloride: 105 mmol/L (ref 101–111)
GFR calc Af Amer: >60 mL/min (ref >60)
GLUCOSE: 134 mg/dL — AB (ref 65–99)
Potassium: 3.5 mmol/L (ref 3.5–5.1)
Sodium: 136 mmol/L (ref 135–145)

## 2017-09-24 LAB — CBC
HCT: 38.8 % (ref 35.0–47.0)
Hemoglobin: 13 g/dL (ref 12.0–16.0)
MCH: 30 pg (ref 26.0–34.0)
MCHC: 33.4 g/dL (ref 32.0–36.0)
MCV: 89.8 fL (ref 80.0–100.0)
PLATELETS: 468 10*3/uL — AB (ref 150–440)
RBC: 4.32 MIL/uL (ref 3.80–5.20)
RDW: 13.7 % (ref 11.5–14.5)
WBC: 8.5 10*3/uL (ref 3.6–11.0)

## 2017-09-24 LAB — TROPONIN I
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
## 2017-09-24 NOTE — ED Triage Notes (Signed)
Pt arrives to ED c/o of heart palpitations, CP, and anxiety. Started on 5mg  lexapro, tried to call PCP recently to see if she could increase it to 10mg  but unable to get in touch. States anxiety started while using the bathroom. Pt appears anxious. States central CP, no radiation. States "a little shortness of breath." alert, oriented, ambulatory.

## 2017-09-24 NOTE — ED Notes (Signed)
Pt boyfriend requested note for work. This RN wrote pt a note per MD Corky Downs verbal orders that states pt can return to work on 09/26/17.

## 2017-09-24 NOTE — ED Notes (Signed)
Pt reports while sitting on the toilet to have a bm today, pt became anxious with sweaty palms and started feeling like she could not breathe.  Pt taking lexapro without relief from anxiety.  Denies si or hi.  Pt alert, calm and cooperative at this time.

## 2018-02-11 ENCOUNTER — Emergency Department
Admission: EM | Admit: 2018-02-11 | Discharge: 2018-02-11 | Disposition: A | Discharge status: Home / Self Care | Payer: Medicaid Other | Attending: Emergency Medicine | Admitting: Emergency Medicine

## 2018-02-11 ENCOUNTER — Emergency Department: Payer: Medicaid Other

## 2018-02-11 ENCOUNTER — Encounter: Payer: Self-pay | Admitting: Emergency Medicine

## 2018-02-11 DIAGNOSIS — Z79899 Other long term (current) drug therapy: Secondary | ICD-10-CM | POA: Insufficient documentation

## 2018-02-11 DIAGNOSIS — Y999 Unspecified external cause status: Secondary | ICD-10-CM | POA: Diagnosis not present

## 2018-02-11 DIAGNOSIS — Y929 Unspecified place or not applicable: Secondary | ICD-10-CM | POA: Diagnosis not present

## 2018-02-11 DIAGNOSIS — S93601A Unspecified sprain of right foot, initial encounter: Secondary | ICD-10-CM | POA: Insufficient documentation

## 2018-02-11 DIAGNOSIS — Y939 Activity, unspecified: Secondary | ICD-10-CM | POA: Insufficient documentation

## 2018-02-11 DIAGNOSIS — X501XXA Overexertion from prolonged static or awkward postures, initial encounter: Secondary | ICD-10-CM | POA: Insufficient documentation

## 2018-02-11 DIAGNOSIS — S99921A Unspecified injury of right foot, initial encounter: Secondary | ICD-10-CM | POA: Diagnosis present

## 2018-02-11 DIAGNOSIS — Z87891 Personal history of nicotine dependence: Secondary | ICD-10-CM | POA: Diagnosis not present

## 2018-02-11 MED ORDER — IBUPROFEN 600 MG PO TABS
600.0000 mg | ORAL_TABLET | Freq: Once | ORAL | Status: AC
  Administered 2018-02-11: 600 mg via ORAL
  Filled 2018-02-11: qty 1

## 2018-02-11 MED ORDER — IBUPROFEN 600 MG PO TABS: 600.0000 mg | ORAL_TABLET | Freq: Three times a day (TID) | ORAL | 0 refills | Status: DC | PRN

## 2018-02-11 NOTE — ED Notes (Signed)
Ace wrap and post op shoe applied to right foot

## 2018-02-11 NOTE — ED Triage Notes (Signed)
Pt comes into the ED via POV c/o right foot pain after stepping in a whole and now the foot is swollen.  No deformity noted to the foot at this time and pulses are still intact.  Patient in NAD with even and unlabored respirations.
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
## 2018-02-11 NOTE — Discharge Instructions (Signed)
Ice and elevate your foot as needed for discomfort.  Wear postop shoe as needed for support.  Tylenol or ibuprofen as needed for pain.

## 2018-03-12 ENCOUNTER — Other Ambulatory Visit: Payer: Self-pay

## 2018-03-12 ENCOUNTER — Emergency Department: Payer: Medicaid Other

## 2018-03-12 ENCOUNTER — Encounter: Payer: Self-pay | Admitting: Emergency Medicine

## 2018-03-12 ENCOUNTER — Emergency Department
Admission: EM | Admit: 2018-03-12 | Discharge: 2018-03-12 | Disposition: A | Discharge status: Home / Self Care | Payer: Medicaid Other | Attending: Emergency Medicine | Admitting: Emergency Medicine

## 2018-03-12 DIAGNOSIS — D75839 Thrombocytosis, unspecified: Secondary | ICD-10-CM

## 2018-03-12 DIAGNOSIS — D473 Essential (hemorrhagic) thrombocythemia: Secondary | ICD-10-CM | POA: Insufficient documentation

## 2018-03-12 DIAGNOSIS — Z79899 Other long term (current) drug therapy: Secondary | ICD-10-CM | POA: Insufficient documentation

## 2018-03-12 DIAGNOSIS — R079 Chest pain, unspecified: Secondary | ICD-10-CM

## 2018-03-12 DIAGNOSIS — R0789 Other chest pain: Secondary | ICD-10-CM | POA: Diagnosis present

## 2018-03-12 DIAGNOSIS — F419 Anxiety disorder, unspecified: Secondary | ICD-10-CM

## 2018-03-12 DIAGNOSIS — E876 Hypokalemia: Secondary | ICD-10-CM

## 2018-03-12 LAB — CBC
HCT: 38.7 % (ref 35.0–47.0)
Hemoglobin: 13.1 g/dL (ref 12.0–16.0)
MCH: 30.3 pg (ref 26.0–34.0)
MCHC: 33.9 g/dL (ref 32.0–36.0)
MCV: 89.3 fL (ref 80.0–100.0)
PLATELETS: 464 10*3/uL — AB (ref 150–440)
RBC: 4.33 MIL/uL (ref 3.80–5.20)
RDW: 13.4 % (ref 11.5–14.5)
WBC: 10.2 10*3/uL (ref 3.6–11.0)

## 2018-03-12 LAB — BASIC METABOLIC PANEL
Anion gap: 12 (ref 5–15)
BUN: 11 mg/dL (ref 6–20)
CALCIUM: 8.9 mg/dL (ref 8.9–10.3)
CO2: 18 mmol/L — ABNORMAL LOW (ref 22–32)
Chloride: 106 mmol/L (ref 98–111)
Creatinine, Ser: 0.8 mg/dL (ref 0.44–1.00)
GFR calc Af Amer: >60 mL/min (ref >60)
GLUCOSE: 117 mg/dL — AB (ref 70–99)
Potassium: 3 mmol/L — ABNORMAL LOW (ref 3.5–5.1)
SODIUM: 136 mmol/L (ref 135–145)

## 2018-03-12 LAB — HCG, QUANTITATIVE, PREGNANCY

## 2018-03-12 LAB — TROPONIN I

## 2018-03-12 MED ORDER — POTASSIUM CHLORIDE CRYS ER 20 MEQ PO TBCR
40.0000 meq | EXTENDED_RELEASE_TABLET | Freq: Once | ORAL | Status: AC
  Administered 2018-03-12: 40 meq via ORAL
  Filled 2018-03-12: qty 2

## 2018-03-12 MED ORDER — LORAZEPAM 1 MG PO TABS
1.0000 mg | ORAL_TABLET | Freq: Once | ORAL | Status: AC
  Administered 2018-03-12: 1 mg via ORAL
  Filled 2018-03-12: qty 1

## 2018-03-12 MED ORDER — FAMOTIDINE 20 MG PO TABS: 20.0000 mg | ORAL_TABLET | Freq: Two times a day (BID) | ORAL | 1 refills | Status: DC

## 2018-03-12 MED ORDER — ASPIRIN 81 MG PO CHEW
324.0000 mg | CHEWABLE_TABLET | Freq: Once | ORAL | Status: AC
  Administered 2018-03-12: 324 mg via ORAL
  Filled 2018-03-12: qty 4

## 2018-03-12 NOTE — ED Notes (Signed)
Patient transported to X-ray 

## 2018-03-12 NOTE — ED Notes (Signed)
ED Provider at bedside. 

## 2018-03-12 NOTE — ED Triage Notes (Signed)
Pt c/o central, non-radiating chest pain that started approximately 1 hour ago. Pt denies n/v.

## 2018-03-12 NOTE — Discharge Instructions (Addendum)
You were seen for chest pain. Your workup today was reassuring. As I explained to you that does not mean that you do not have heart disease. You may need further evaluation to ensure you do not have a serious heart problem. Therefore it is imperative that you follow up with your doctor in 1-2 days for further evaluation.   As I explained to you, your platelets have been elevated for several years but it is important that you discuss this with your doctor for further outpatient evaluation.  When should you call for help?  Call 911 if: You passed out (lost consciousness) or if you feel dizzy. You have difficulty breathing. You have symptoms of a heart attack. These may include: Chest pain or pressure, or a strange feeling in your chest. Indigestion. Sweating. Shortness of breath. Nausea or vomiting. Pain, pressure, or a strange feeling in your back, neck, jaw, or upper belly or in one or both shoulders or arms. Lightheadedness or sudden weakness. A fast or irregular heartbeat. After you call 911, the operator may tell you to chew 1 adult-strength or 2 to 4 low-dose aspirin. Wait for an ambulance. Do not try to drive yourself.   Call your doctor today if: You have any trouble breathing. Your chest pain gets worse. You are dizzy or lightheaded, or you feel like you may faint. You are not getting better as expected. You are having new or different chest pain  How can you care for yourself at home? Rest until you feel better. Take your medicine exactly as prescribed. Call your doctor if you think you are having a problem with your medicine. Do not drive after taking a prescription pain medicine.
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
## 2018-03-12 NOTE — ED Notes (Signed)
Pt given PO fluids to tolerate. Pt drank 48 oz of water.

## 2018-03-27 ENCOUNTER — Inpatient Hospital Stay: Payer: Medicaid Other | Attending: Oncology | Admitting: Oncology

## 2018-03-27 ENCOUNTER — Ambulatory Visit: Payer: Self-pay | Admitting: Oncology

## 2018-03-27 ENCOUNTER — Inpatient Hospital Stay: Payer: Medicaid Other

## 2018-03-27 ENCOUNTER — Encounter: Payer: Self-pay | Admitting: Oncology

## 2018-03-27 ENCOUNTER — Other Ambulatory Visit: Payer: Self-pay

## 2018-03-27 VITALS — BP 146/90 | HR 89 | Temp 97.6°F | Resp 20 | Wt 307.6 lb

## 2018-03-27 DIAGNOSIS — D75839 Thrombocytosis, unspecified: Secondary | ICD-10-CM

## 2018-03-27 DIAGNOSIS — F419 Anxiety disorder, unspecified: Secondary | ICD-10-CM | POA: Diagnosis not present

## 2018-03-27 DIAGNOSIS — D473 Essential (hemorrhagic) thrombocythemia: Secondary | ICD-10-CM | POA: Insufficient documentation

## 2018-03-27 DIAGNOSIS — F4310 Post-traumatic stress disorder, unspecified: Secondary | ICD-10-CM | POA: Diagnosis not present

## 2018-03-27 LAB — CBC WITH DIFFERENTIAL/PLATELET
Basophils Absolute: 0.1 10*3/uL (ref 0–0.1)
Basophils Relative: 1 %
Eosinophils Absolute: 0.2 10*3/uL (ref 0–0.7)
Eosinophils Relative: 3 %
HCT: 38.5 % (ref 35.0–47.0)
HEMOGLOBIN: 13.2 g/dL (ref 12.0–16.0)
LYMPHS ABS: 2.9 10*3/uL (ref 1.0–3.6)
Lymphocytes Relative: 48 %
MCH: 30.8 pg (ref 26.0–34.0)
MCHC: 34.4 g/dL (ref 32.0–36.0)
MCV: 89.7 fL (ref 80.0–100.0)
MONOS PCT: 8 %
Monocytes Absolute: 0.5 10*3/uL (ref 0.2–0.9)
NEUTROS ABS: 2.5 10*3/uL (ref 1.4–6.5)
NEUTROS PCT: 40 %
Platelets: 424 10*3/uL (ref 150–440)
RBC: 4.29 MIL/uL (ref 3.80–5.20)
RDW: 13.3 % (ref 11.5–14.5)
WBC: 6.1 10*3/uL (ref 3.6–11.0)

## 2018-03-27 LAB — HEPATIC FUNCTION PANEL
ALBUMIN: 3.8 g/dL (ref 3.5–5.0)
ALT: 10 U/L (ref 0–44)
AST: 19 U/L (ref 15–41)
Alkaline Phosphatase: 53 U/L (ref 38–126)
BILIRUBIN TOTAL: 0.4 mg/dL (ref 0.3–1.2)
Bilirubin, Direct: <0.1 mg/dL (ref 0.0–0.2)
TOTAL PROTEIN: 7.4 g/dL (ref 6.5–8.1)

## 2018-03-27 LAB — TECHNOLOGIST SMEAR REVIEW: TECH REVIEW: NORMAL

## 2018-03-27 LAB — FERRITIN: Ferritin: 23 ng/mL (ref 11–307)

## 2018-03-27 LAB — IRON AND TIBC
Iron: 56 ug/dL (ref 28–170)
Saturation Ratios: 16 % (ref 10.4–31.8)
TIBC: 352 ug/dL (ref 250–450)
UIBC: 296 ug/dL

## 2018-03-27 LAB — TSH: TSH: 0.978 u[IU]/mL (ref 0.350–4.500)

## 2018-03-27 NOTE — Progress Notes (Signed)
Patient here for initial evaluation.  °
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
## 2018-04-04 LAB — CALR + JAK2 E12-15 + MPL (REFLEXED)

## 2018-04-04 LAB — JAK2 V617F, W REFLEX TO CALR/E12/MPL

## 2018-04-10 ENCOUNTER — Inpatient Hospital Stay (HOSPITAL_BASED_OUTPATIENT_CLINIC_OR_DEPARTMENT_OTHER): Payer: Medicaid Other | Admitting: Oncology

## 2018-04-10 ENCOUNTER — Encounter: Payer: Self-pay | Admitting: Oncology

## 2018-04-10 VITALS — BP 142/95 | HR 81 | Temp 97.8°F | Resp 18 | Wt 309.4 lb

## 2018-04-10 DIAGNOSIS — D75839 Thrombocytosis, unspecified: Secondary | ICD-10-CM

## 2018-04-10 DIAGNOSIS — D473 Essential (hemorrhagic) thrombocythemia: Secondary | ICD-10-CM

## 2018-04-10 MED ORDER — FERROUS SULFATE 325 (65 FE) MG PO TBEC: 325.0000 mg | DELAYED_RELEASE_TABLET | Freq: Two times a day (BID) | ORAL | 3 refills | Status: DC

## 2018-04-10 NOTE — Progress Notes (Signed)
Pt in for follow up and results. Denies any difficulties or concerns.  Mother with pt today.
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
## 2018-07-11 ENCOUNTER — Inpatient Hospital Stay: Payer: Medicaid Other | Attending: Oncology

## 2018-07-11 ENCOUNTER — Other Ambulatory Visit: Payer: Self-pay

## 2018-07-11 DIAGNOSIS — D473 Essential (hemorrhagic) thrombocythemia: Secondary | ICD-10-CM

## 2018-07-11 DIAGNOSIS — D75839 Thrombocytosis, unspecified: Secondary | ICD-10-CM

## 2018-07-11 LAB — CBC WITH DIFFERENTIAL/PLATELET
ABS IMMATURE GRANULOCYTES: 0.01 10*3/uL (ref 0.00–0.07)
BASOS ABS: 0 10*3/uL (ref 0.0–0.1)
BASOS PCT: 1 %
Eosinophils Absolute: 0.2 10*3/uL (ref 0.0–0.5)
Eosinophils Relative: 4 %
HCT: 37.6 % (ref 36.0–46.0)
HEMOGLOBIN: 12.4 g/dL (ref 12.0–15.0)
IMMATURE GRANULOCYTES: 0 %
LYMPHS PCT: 43 %
Lymphs Abs: 2.9 10*3/uL (ref 0.7–4.0)
MCH: 29.4 pg (ref 26.0–34.0)
MCHC: 33 g/dL (ref 30.0–36.0)
MCV: 89.1 fL (ref 80.0–100.0)
Monocytes Absolute: 0.7 10*3/uL (ref 0.1–1.0)
Monocytes Relative: 10 %
NEUTROS ABS: 2.8 10*3/uL (ref 1.7–7.7)
NEUTROS PCT: 42 %
NRBC: 0 % (ref 0.0–0.2)
PLATELETS: 473 10*3/uL — AB (ref 150–400)
RBC: 4.22 MIL/uL (ref 3.87–5.11)
RDW: 12.6 % (ref 11.5–15.5)
WBC: 6.7 10*3/uL (ref 4.0–10.5)

## 2018-07-11 LAB — IRON AND TIBC
IRON: 93 ug/dL (ref 28–170)
Saturation Ratios: 28 % (ref 10.4–31.8)
TIBC: 327 ug/dL (ref 250–450)
UIBC: 234 ug/dL

## 2018-07-11 LAB — FERRITIN: Ferritin: 38 ng/mL (ref 11–307)

## 2018-07-12 ENCOUNTER — Encounter: Payer: Self-pay | Admitting: Oncology

## 2018-07-12 ENCOUNTER — Inpatient Hospital Stay (HOSPITAL_BASED_OUTPATIENT_CLINIC_OR_DEPARTMENT_OTHER): Payer: Medicaid Other | Admitting: Oncology

## 2018-07-12 ENCOUNTER — Other Ambulatory Visit: Payer: Self-pay

## 2018-07-12 VITALS — BP 139/88 | HR 109 | Temp 97.6°F | Resp 18 | Wt 313.2 lb

## 2018-07-12 DIAGNOSIS — D473 Essential (hemorrhagic) thrombocythemia: Secondary | ICD-10-CM | POA: Diagnosis not present

## 2018-07-12 DIAGNOSIS — D75839 Thrombocytosis, unspecified: Secondary | ICD-10-CM

## 2018-07-12 NOTE — Progress Notes (Signed)
Patient here for follow up
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
## 2018-09-14 IMAGING — CR DG CHEST 2V
2 series · 2 of 2 positions shown · non-contrast
Comparison: Prior CT and radiograph from 11/30/2016.

CLINICAL DATA: Initial evaluation for acute shortness of breath.

EXAM:
CHEST  2 VIEW

[chest pa]
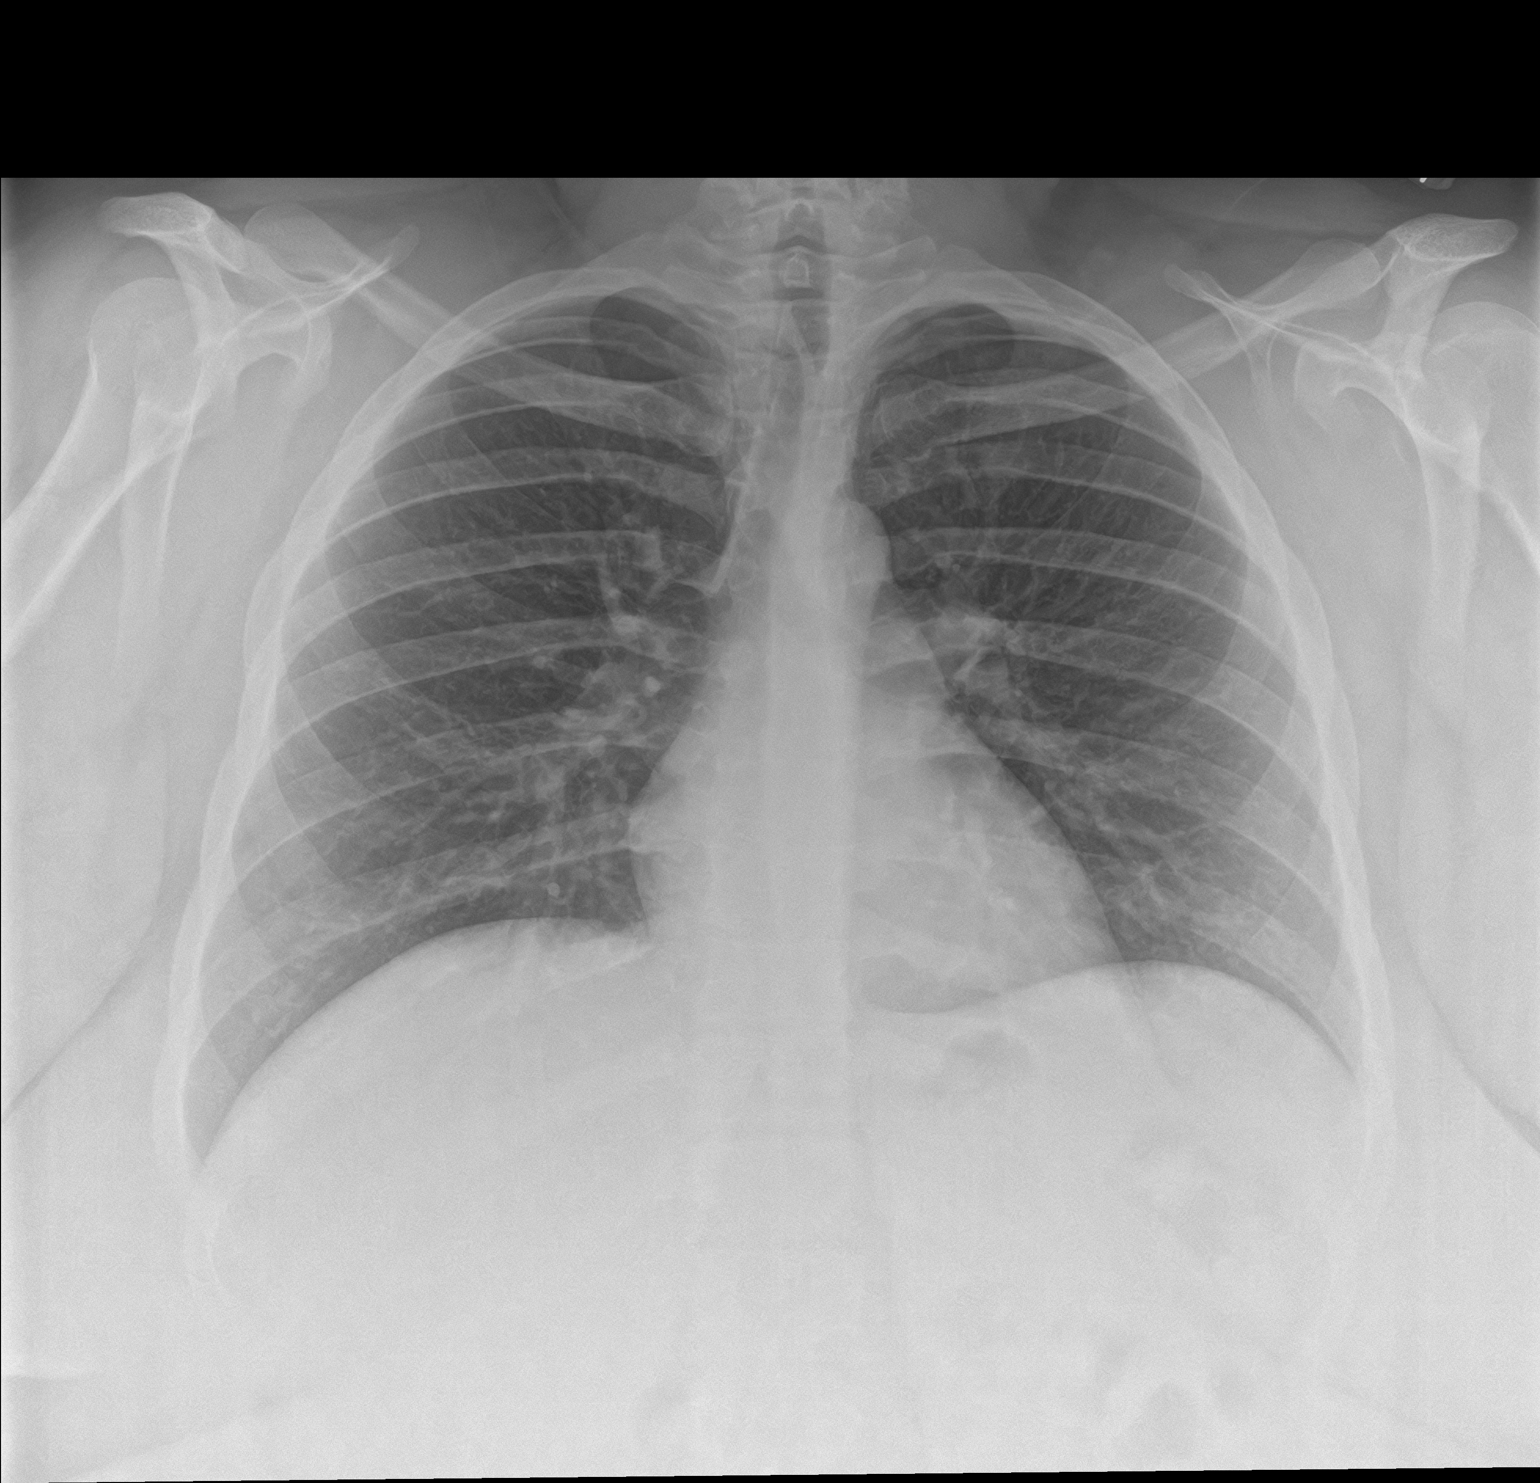

[chest lat]
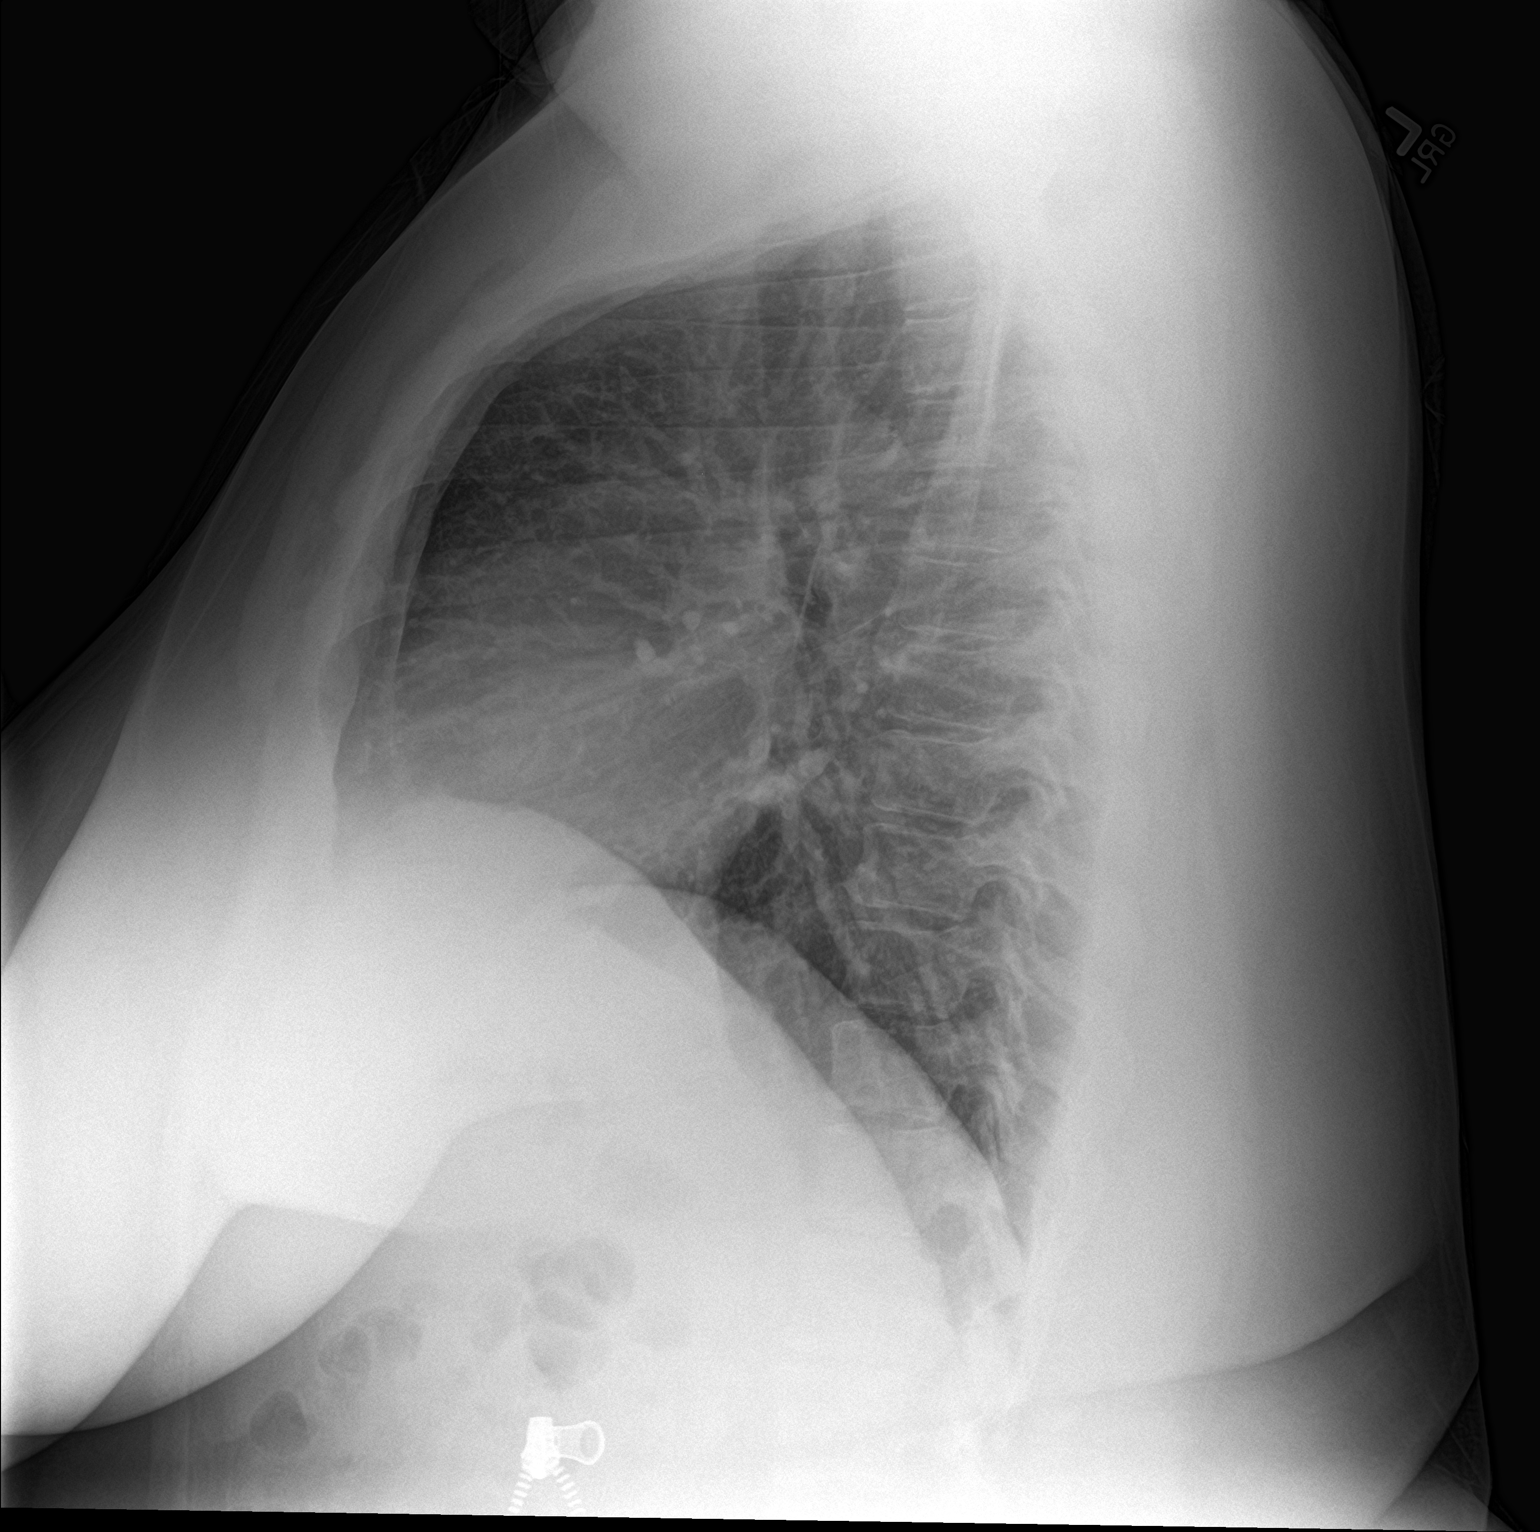

[2 of 2 positions shown; findings below may reference images not displayed]

FINDINGS: The cardiac and mediastinal silhouettes are stable in size and
contour, and remain within normal limits.

The lungs are normally inflated. No airspace consolidation, pleural
effusion, or pulmonary edema is identified. There is no
pneumothorax.

No acute osseous abnormality identified.
IMPRESSION: No active cardiopulmonary disease.

## 2018-10-14 ENCOUNTER — Inpatient Hospital Stay: Payer: Medicaid Other | Attending: Oncology

## 2018-10-14 DIAGNOSIS — R7989 Other specified abnormal findings of blood chemistry: Secondary | ICD-10-CM | POA: Insufficient documentation

## 2018-10-14 DIAGNOSIS — D473 Essential (hemorrhagic) thrombocythemia: Secondary | ICD-10-CM

## 2018-10-14 DIAGNOSIS — Z79899 Other long term (current) drug therapy: Secondary | ICD-10-CM | POA: Diagnosis not present

## 2018-10-14 DIAGNOSIS — F4310 Post-traumatic stress disorder, unspecified: Secondary | ICD-10-CM | POA: Diagnosis not present

## 2018-10-14 DIAGNOSIS — Z87891 Personal history of nicotine dependence: Secondary | ICD-10-CM | POA: Diagnosis not present

## 2018-10-14 DIAGNOSIS — D75839 Thrombocytosis, unspecified: Secondary | ICD-10-CM

## 2018-10-14 LAB — CBC WITH DIFFERENTIAL/PLATELET
Abs Immature Granulocytes: 0.02 10*3/uL (ref 0.00–0.07)
BASOS PCT: 1 %
Basophils Absolute: 0.1 10*3/uL (ref 0.0–0.1)
Eosinophils Absolute: 0.5 10*3/uL (ref 0.0–0.5)
Eosinophils Relative: 6 %
HCT: 39.2 % (ref 36.0–46.0)
Hemoglobin: 12.7 g/dL (ref 12.0–15.0)
Immature Granulocytes: 0 %
Lymphocytes Relative: 44 %
Lymphs Abs: 3.8 10*3/uL (ref 0.7–4.0)
MCH: 29.3 pg (ref 26.0–34.0)
MCHC: 32.4 g/dL (ref 30.0–36.0)
MCV: 90.5 fL (ref 80.0–100.0)
MONOS PCT: 10 %
Monocytes Absolute: 0.8 10*3/uL (ref 0.1–1.0)
Neutro Abs: 3.3 10*3/uL (ref 1.7–7.7)
Neutrophils Relative %: 39 %
Platelets: 405 10*3/uL — ABNORMAL HIGH (ref 150–400)
RBC: 4.33 MIL/uL (ref 3.87–5.11)
RDW: 13.3 % (ref 11.5–15.5)
WBC: 8.5 10*3/uL (ref 4.0–10.5)
nRBC: 0 % (ref 0.0–0.2)

## 2018-10-14 LAB — IRON AND TIBC
Iron: 106 ug/dL (ref 28–170)
Saturation Ratios: 29 % (ref 10.4–31.8)
TIBC: 364 ug/dL (ref 250–450)
UIBC: 258 ug/dL

## 2018-10-14 LAB — FERRITIN: Ferritin: 32 ng/mL (ref 11–307)

## 2018-10-15 ENCOUNTER — Encounter: Payer: Self-pay | Admitting: *Deleted

## 2018-10-15 ENCOUNTER — Other Ambulatory Visit: Payer: Self-pay

## 2018-10-15 ENCOUNTER — Encounter: Payer: Self-pay | Admitting: Oncology

## 2018-10-15 ENCOUNTER — Inpatient Hospital Stay (HOSPITAL_BASED_OUTPATIENT_CLINIC_OR_DEPARTMENT_OTHER): Payer: Medicaid Other | Admitting: Oncology

## 2018-10-15 VITALS — BP 151/92 | HR 109 | Temp 97.4°F | Resp 18 | Wt 321.5 lb

## 2018-10-15 DIAGNOSIS — R7989 Other specified abnormal findings of blood chemistry: Secondary | ICD-10-CM

## 2018-10-15 DIAGNOSIS — Z87891 Personal history of nicotine dependence: Secondary | ICD-10-CM | POA: Diagnosis not present

## 2018-10-15 DIAGNOSIS — D473 Essential (hemorrhagic) thrombocythemia: Secondary | ICD-10-CM

## 2018-10-15 DIAGNOSIS — D75839 Thrombocytosis, unspecified: Secondary | ICD-10-CM

## 2018-10-15 DIAGNOSIS — F4310 Post-traumatic stress disorder, unspecified: Secondary | ICD-10-CM | POA: Diagnosis not present

## 2018-10-15 DIAGNOSIS — Z79899 Other long term (current) drug therapy: Secondary | ICD-10-CM | POA: Diagnosis not present

## 2018-10-15 NOTE — Progress Notes (Signed)
Patient here for follow up. States she has not taken iron pills because she went to pick up and pharmacy and they were not there. Pt states she is very anxious being here.
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
## 2018-10-23 ENCOUNTER — Encounter: Payer: Self-pay | Admitting: Oncology

## 2019-04-15 ENCOUNTER — Inpatient Hospital Stay (HOSPITAL_BASED_OUTPATIENT_CLINIC_OR_DEPARTMENT_OTHER): Payer: Medicaid Other | Admitting: Oncology

## 2019-04-15 ENCOUNTER — Encounter: Payer: Self-pay | Admitting: Oncology

## 2019-04-15 ENCOUNTER — Inpatient Hospital Stay: Payer: Medicaid Other | Attending: Oncology

## 2019-04-15 ENCOUNTER — Other Ambulatory Visit: Payer: Self-pay

## 2019-04-15 VITALS — BP 148/83 | HR 91 | Temp 97.4°F | Wt 324.2 lb

## 2019-04-15 DIAGNOSIS — F419 Anxiety disorder, unspecified: Secondary | ICD-10-CM | POA: Insufficient documentation

## 2019-04-15 DIAGNOSIS — D473 Essential (hemorrhagic) thrombocythemia: Secondary | ICD-10-CM | POA: Insufficient documentation

## 2019-04-15 DIAGNOSIS — F4310 Post-traumatic stress disorder, unspecified: Secondary | ICD-10-CM

## 2019-04-15 DIAGNOSIS — D75839 Thrombocytosis, unspecified: Secondary | ICD-10-CM

## 2019-04-15 LAB — CBC WITH DIFFERENTIAL/PLATELET
Abs Immature Granulocytes: 0.02 10*3/uL (ref 0.00–0.07)
Basophils Absolute: 0.1 10*3/uL (ref 0.0–0.1)
Basophils Relative: 1 %
Eosinophils Absolute: 0.5 10*3/uL (ref 0.0–0.5)
Eosinophils Relative: 5 %
HCT: 40.2 % (ref 36.0–46.0)
Hemoglobin: 13.4 g/dL (ref 12.0–15.0)
Immature Granulocytes: 0 %
Lymphocytes Relative: 47 %
Lymphs Abs: 4.2 10*3/uL — ABNORMAL HIGH (ref 0.7–4.0)
MCH: 30.2 pg (ref 26.0–34.0)
MCHC: 33.3 g/dL (ref 30.0–36.0)
MCV: 90.5 fL (ref 80.0–100.0)
Monocytes Absolute: 0.8 10*3/uL (ref 0.1–1.0)
Monocytes Relative: 9 %
Neutro Abs: 3.5 10*3/uL (ref 1.7–7.7)
Neutrophils Relative %: 38 %
Platelets: 436 10*3/uL — ABNORMAL HIGH (ref 150–400)
RBC: 4.44 MIL/uL (ref 3.87–5.11)
RDW: 13 % (ref 11.5–15.5)
WBC: 9.2 10*3/uL (ref 4.0–10.5)
nRBC: 0 % (ref 0.0–0.2)

## 2019-04-15 LAB — IRON AND TIBC
Iron: 42 ug/dL (ref 28–170)
Saturation Ratios: 11 % (ref 10.4–31.8)
TIBC: 368 ug/dL (ref 250–450)
UIBC: 326 ug/dL

## 2019-04-15 LAB — FERRITIN: Ferritin: 36 ng/mL (ref 11–307)

## 2019-04-15 MED ORDER — FERROUS SULFATE 325 (65 FE) MG PO TBEC: 325.0000 mg | DELAYED_RELEASE_TABLET | Freq: Two times a day (BID) | ORAL | 1 refills | Status: DC

## 2019-04-15 NOTE — Progress Notes (Signed)
Patient here for follow up.  Patient states that she has not been taking Ferrous Sulfate due to pharmacy stating they did not receive an rx.

## 2019-04-16 NOTE — Addendum Note (Signed)
Addended by: Earlie Server on: 04/16/2019 08:56 AM   Modules accepted: Orders
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
## 2019-04-18 ENCOUNTER — Other Ambulatory Visit: Payer: Self-pay | Admitting: Internal Medicine

## 2019-04-18 DIAGNOSIS — Z20822 Contact with and (suspected) exposure to covid-19: Secondary | ICD-10-CM

## 2019-04-20 LAB — NOVEL CORONAVIRUS, NAA: SARS-CoV-2, NAA: NOT DETECTED

## 2019-04-22 ENCOUNTER — Telehealth: Payer: Self-pay

## 2019-04-22 NOTE — Telephone Encounter (Signed)
Advised patient her COVID 19 test result is negative.  °

## 2019-06-10 ENCOUNTER — Other Ambulatory Visit: Payer: Self-pay

## 2019-06-10 DIAGNOSIS — Z20822 Contact with and (suspected) exposure to covid-19: Secondary | ICD-10-CM

## 2019-06-11 LAB — NOVEL CORONAVIRUS, NAA: SARS-CoV-2, NAA: NOT DETECTED

## 2019-10-10 ENCOUNTER — Inpatient Hospital Stay: Payer: Medicaid Other | Attending: Oncology

## 2019-10-10 ENCOUNTER — Other Ambulatory Visit: Payer: Self-pay

## 2019-10-10 DIAGNOSIS — D473 Essential (hemorrhagic) thrombocythemia: Secondary | ICD-10-CM

## 2019-10-10 DIAGNOSIS — E611 Iron deficiency: Secondary | ICD-10-CM | POA: Diagnosis not present

## 2019-10-10 DIAGNOSIS — D75839 Thrombocytosis, unspecified: Secondary | ICD-10-CM

## 2019-10-10 DIAGNOSIS — F419 Anxiety disorder, unspecified: Secondary | ICD-10-CM | POA: Insufficient documentation

## 2019-10-10 DIAGNOSIS — R7989 Other specified abnormal findings of blood chemistry: Secondary | ICD-10-CM | POA: Insufficient documentation

## 2019-10-10 LAB — CBC WITH DIFFERENTIAL/PLATELET
Abs Immature Granulocytes: 0.02 10*3/uL (ref 0.00–0.07)
Basophils Absolute: 0.1 10*3/uL (ref 0.0–0.1)
Basophils Relative: 1 %
Eosinophils Absolute: 0.5 10*3/uL (ref 0.0–0.5)
Eosinophils Relative: 5 %
HCT: 39.9 % (ref 36.0–46.0)
Hemoglobin: 12.7 g/dL (ref 12.0–15.0)
Immature Granulocytes: 0 %
Lymphocytes Relative: 44 %
Lymphs Abs: 4 10*3/uL (ref 0.7–4.0)
MCH: 30.1 pg (ref 26.0–34.0)
MCHC: 31.8 g/dL (ref 30.0–36.0)
MCV: 94.5 fL (ref 80.0–100.0)
Monocytes Absolute: 0.8 10*3/uL (ref 0.1–1.0)
Monocytes Relative: 9 %
Neutro Abs: 3.7 10*3/uL (ref 1.7–7.7)
Neutrophils Relative %: 41 %
Platelets: 447 10*3/uL — ABNORMAL HIGH (ref 150–400)
RBC: 4.22 MIL/uL (ref 3.87–5.11)
RDW: 12.8 % (ref 11.5–15.5)
WBC: 9 10*3/uL (ref 4.0–10.5)
nRBC: 0 % (ref 0.0–0.2)

## 2019-10-10 LAB — RETIC PANEL
Immature Retic Fract: 11.8 % (ref 2.3–15.9)
RBC.: 4.23 MIL/uL (ref 3.87–5.11)
Retic Count, Absolute: 81.6 10*3/uL (ref 19.0–186.0)
Retic Ct Pct: 1.9 % (ref 0.4–3.1)
Reticulocyte Hemoglobin: 33.4 pg (ref >27.9)

## 2019-10-13 ENCOUNTER — Encounter: Payer: Self-pay | Admitting: Oncology

## 2019-10-13 ENCOUNTER — Other Ambulatory Visit: Payer: Medicaid Other

## 2019-10-13 ENCOUNTER — Inpatient Hospital Stay (HOSPITAL_BASED_OUTPATIENT_CLINIC_OR_DEPARTMENT_OTHER): Payer: Medicaid Other | Admitting: Oncology

## 2019-10-13 DIAGNOSIS — D473 Essential (hemorrhagic) thrombocythemia: Secondary | ICD-10-CM | POA: Diagnosis not present

## 2019-10-13 DIAGNOSIS — D75839 Thrombocytosis, unspecified: Secondary | ICD-10-CM

## 2019-10-13 DIAGNOSIS — E611 Iron deficiency: Secondary | ICD-10-CM | POA: Diagnosis not present

## 2019-10-13 NOTE — Progress Notes (Signed)
Patient contacted for Mychart visit. No concerns voiced. 

## 2019-10-14 ENCOUNTER — Encounter: Payer: Self-pay | Admitting: Oncology

## 2019-10-14 ENCOUNTER — Other Ambulatory Visit: Payer: Self-pay

## 2019-10-14 DIAGNOSIS — D473 Essential (hemorrhagic) thrombocythemia: Secondary | ICD-10-CM | POA: Insufficient documentation

## 2019-10-14 DIAGNOSIS — D75839 Thrombocytosis, unspecified: Secondary | ICD-10-CM | POA: Insufficient documentation

## 2019-10-14 MED ORDER — FERROUS SULFATE 325 (65 FE) MG PO TBEC: 325.0000 mg | DELAYED_RELEASE_TABLET | Freq: Two times a day (BID) | ORAL | 1 refills | Status: DC
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
## 2020-04-09 ENCOUNTER — Other Ambulatory Visit: Payer: Self-pay

## 2020-04-09 ENCOUNTER — Inpatient Hospital Stay: Payer: Medicaid Other | Attending: Oncology

## 2020-04-09 DIAGNOSIS — D509 Iron deficiency anemia, unspecified: Secondary | ICD-10-CM | POA: Diagnosis present

## 2020-04-09 DIAGNOSIS — F419 Anxiety disorder, unspecified: Secondary | ICD-10-CM | POA: Diagnosis not present

## 2020-04-09 DIAGNOSIS — Z8249 Family history of ischemic heart disease and other diseases of the circulatory system: Secondary | ICD-10-CM | POA: Insufficient documentation

## 2020-04-09 DIAGNOSIS — R7989 Other specified abnormal findings of blood chemistry: Secondary | ICD-10-CM | POA: Insufficient documentation

## 2020-04-09 DIAGNOSIS — Z79899 Other long term (current) drug therapy: Secondary | ICD-10-CM | POA: Diagnosis not present

## 2020-04-09 DIAGNOSIS — Z803 Family history of malignant neoplasm of breast: Secondary | ICD-10-CM | POA: Diagnosis not present

## 2020-04-09 DIAGNOSIS — D473 Essential (hemorrhagic) thrombocythemia: Secondary | ICD-10-CM

## 2020-04-09 DIAGNOSIS — E611 Iron deficiency: Secondary | ICD-10-CM

## 2020-04-09 DIAGNOSIS — D75839 Thrombocytosis, unspecified: Secondary | ICD-10-CM

## 2020-04-09 LAB — CBC WITH DIFFERENTIAL/PLATELET
Abs Immature Granulocytes: 0.01 10*3/uL (ref 0.00–0.07)
Basophils Absolute: 0.1 10*3/uL (ref 0.0–0.1)
Basophils Relative: 1 %
Eosinophils Absolute: 0.4 10*3/uL (ref 0.0–0.5)
Eosinophils Relative: 5 %
HCT: 37.8 % (ref 36.0–46.0)
Hemoglobin: 13 g/dL (ref 12.0–15.0)
Immature Granulocytes: 0 %
Lymphocytes Relative: 44 %
Lymphs Abs: 3.1 10*3/uL (ref 0.7–4.0)
MCH: 31 pg (ref 26.0–34.0)
MCHC: 34.4 g/dL (ref 30.0–36.0)
MCV: 90 fL (ref 80.0–100.0)
Monocytes Absolute: 0.6 10*3/uL (ref 0.1–1.0)
Monocytes Relative: 8 %
Neutro Abs: 3 10*3/uL (ref 1.7–7.7)
Neutrophils Relative %: 42 %
Platelets: 386 10*3/uL (ref 150–400)
RBC: 4.2 MIL/uL (ref 3.87–5.11)
RDW: 12.9 % (ref 11.5–15.5)
WBC: 7.1 10*3/uL (ref 4.0–10.5)
nRBC: 0 % (ref 0.0–0.2)

## 2020-04-09 LAB — COMPREHENSIVE METABOLIC PANEL
ALT: 13 U/L (ref 0–44)
AST: 20 U/L (ref 15–41)
Albumin: 3.8 g/dL (ref 3.5–5.0)
Alkaline Phosphatase: 44 U/L (ref 38–126)
Anion gap: 7 (ref 5–15)
BUN: 9 mg/dL (ref 6–20)
CO2: 23 mmol/L (ref 22–32)
Calcium: 8.4 mg/dL — ABNORMAL LOW (ref 8.9–10.3)
Chloride: 107 mmol/L (ref 98–111)
Creatinine, Ser: 0.7 mg/dL (ref 0.44–1.00)
GFR calc Af Amer: >60 mL/min (ref >60)
GFR calc non Af Amer: >60 mL/min (ref >60)
Glucose, Bld: 101 mg/dL — ABNORMAL HIGH (ref 70–99)
Potassium: 3.8 mmol/L (ref 3.5–5.1)
Sodium: 137 mmol/L (ref 135–145)
Total Bilirubin: 0.7 mg/dL (ref 0.3–1.2)
Total Protein: 7.5 g/dL (ref 6.5–8.1)

## 2020-04-09 LAB — FERRITIN: Ferritin: 53 ng/mL (ref 11–307)

## 2020-04-09 LAB — IRON AND TIBC
Iron: 78 ug/dL (ref 28–170)
Saturation Ratios: 22 % (ref 10.4–31.8)
TIBC: 363 ug/dL (ref 250–450)
UIBC: 285 ug/dL

## 2020-04-12 ENCOUNTER — Inpatient Hospital Stay (HOSPITAL_BASED_OUTPATIENT_CLINIC_OR_DEPARTMENT_OTHER): Payer: Medicaid Other | Admitting: Oncology

## 2020-04-12 ENCOUNTER — Encounter: Payer: Self-pay | Admitting: Oncology

## 2020-04-12 DIAGNOSIS — D473 Essential (hemorrhagic) thrombocythemia: Secondary | ICD-10-CM

## 2020-04-12 DIAGNOSIS — D75839 Thrombocytosis, unspecified: Secondary | ICD-10-CM
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
## 2020-04-12 NOTE — Progress Notes (Signed)
Patient denies new problems/concerns today.   °

## 2020-10-08 ENCOUNTER — Inpatient Hospital Stay: Payer: Medicaid Other | Attending: Oncology

## 2020-10-08 DIAGNOSIS — F419 Anxiety disorder, unspecified: Secondary | ICD-10-CM | POA: Insufficient documentation

## 2020-10-08 DIAGNOSIS — E611 Iron deficiency: Secondary | ICD-10-CM | POA: Diagnosis not present

## 2020-10-08 DIAGNOSIS — D75839 Thrombocytosis, unspecified: Secondary | ICD-10-CM | POA: Diagnosis present

## 2020-10-08 LAB — CBC WITH DIFFERENTIAL/PLATELET
Abs Immature Granulocytes: 0.01 10*3/uL (ref 0.00–0.07)
Basophils Absolute: 0.1 10*3/uL (ref 0.0–0.1)
Basophils Relative: 1 %
Eosinophils Absolute: 0.5 10*3/uL (ref 0.0–0.5)
Eosinophils Relative: 5 %
HCT: 38.5 % (ref 36.0–46.0)
Hemoglobin: 13 g/dL (ref 12.0–15.0)
Immature Granulocytes: 0 %
Lymphocytes Relative: 47 %
Lymphs Abs: 4.1 10*3/uL — ABNORMAL HIGH (ref 0.7–4.0)
MCH: 31 pg (ref 26.0–34.0)
MCHC: 33.8 g/dL (ref 30.0–36.0)
MCV: 91.9 fL (ref 80.0–100.0)
Monocytes Absolute: 1 10*3/uL (ref 0.1–1.0)
Monocytes Relative: 11 %
Neutro Abs: 3.2 10*3/uL (ref 1.7–7.7)
Neutrophils Relative %: 36 %
Platelets: 395 10*3/uL (ref 150–400)
RBC: 4.19 MIL/uL (ref 3.87–5.11)
RDW: 12.7 % (ref 11.5–15.5)
WBC: 8.8 10*3/uL (ref 4.0–10.5)
nRBC: 0 % (ref 0.0–0.2)

## 2020-10-08 LAB — IRON AND TIBC
Iron: 66 ug/dL (ref 28–170)
Saturation Ratios: 18 % (ref 10.4–31.8)
TIBC: 370 ug/dL (ref 250–450)
UIBC: 304 ug/dL

## 2020-10-08 LAB — FERRITIN: Ferritin: 54 ng/mL (ref 11–307)

## 2020-10-09 ENCOUNTER — Encounter: Payer: Self-pay | Admitting: Oncology

## 2020-10-11 ENCOUNTER — Encounter: Payer: Self-pay | Admitting: Oncology

## 2020-10-11 ENCOUNTER — Inpatient Hospital Stay (HOSPITAL_BASED_OUTPATIENT_CLINIC_OR_DEPARTMENT_OTHER): Payer: Medicaid Other | Admitting: Oncology

## 2020-10-11 DIAGNOSIS — E611 Iron deficiency: Secondary | ICD-10-CM | POA: Diagnosis not present

## 2020-10-11 DIAGNOSIS — D75839 Thrombocytosis, unspecified: Secondary | ICD-10-CM
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
## 2020-10-11 NOTE — Progress Notes (Signed)
Patient contacted for Mychart visit. No new concerns voiced.  

## 2021-07-28 ENCOUNTER — Telehealth: Payer: Self-pay

## 2021-07-28 NOTE — Telephone Encounter (Signed)
Patient is scheduled for 08/09/21 with [facility]

## 2021-07-28 NOTE — Telephone Encounter (Signed)
[facility] referring for pregnancy. Paper records. Attempt to reach patient via phone. Patient is requesting to call back to be scheduled.

## 2021-08-09 ENCOUNTER — Encounter: Payer: Self-pay | Admitting: Obstetrics

## 2021-09-02 ENCOUNTER — Other Ambulatory Visit: Payer: Medicaid Other

## 2021-09-02 ENCOUNTER — Other Ambulatory Visit: Payer: Self-pay

## 2021-09-28 ENCOUNTER — Other Ambulatory Visit: Payer: Self-pay | Admitting: *Deleted

## 2021-09-28 DIAGNOSIS — E611 Iron deficiency: Secondary | ICD-10-CM

## 2021-10-07 ENCOUNTER — Inpatient Hospital Stay: Payer: Medicaid Other | Attending: Oncology

## 2021-10-07 DIAGNOSIS — Z86718 Personal history of other venous thrombosis and embolism: Secondary | ICD-10-CM | POA: Insufficient documentation

## 2021-10-07 DIAGNOSIS — D72829 Elevated white blood cell count, unspecified: Secondary | ICD-10-CM | POA: Insufficient documentation

## 2021-10-07 DIAGNOSIS — O99012 Anemia complicating pregnancy, second trimester: Secondary | ICD-10-CM | POA: Insufficient documentation

## 2021-10-07 DIAGNOSIS — O99112 Other diseases of the blood and blood-forming organs and certain disorders involving the immune mechanism complicating pregnancy, second trimester: Secondary | ICD-10-CM | POA: Insufficient documentation

## 2021-10-07 DIAGNOSIS — F1290 Cannabis use, unspecified, uncomplicated: Secondary | ICD-10-CM | POA: Insufficient documentation

## 2021-10-07 DIAGNOSIS — O9912 Other diseases of the blood and blood-forming organs and certain disorders involving the immune mechanism complicating childbirth: Secondary | ICD-10-CM | POA: Insufficient documentation

## 2021-10-07 DIAGNOSIS — O99322 Drug use complicating pregnancy, second trimester: Secondary | ICD-10-CM | POA: Insufficient documentation

## 2021-10-07 DIAGNOSIS — D75839 Thrombocytosis, unspecified: Secondary | ICD-10-CM | POA: Insufficient documentation

## 2021-10-07 DIAGNOSIS — Z3A27 27 weeks gestation of pregnancy: Secondary | ICD-10-CM | POA: Insufficient documentation

## 2021-10-07 DIAGNOSIS — D509 Iron deficiency anemia, unspecified: Secondary | ICD-10-CM | POA: Insufficient documentation

## 2021-10-10 ENCOUNTER — Inpatient Hospital Stay: Payer: Medicaid Other | Admitting: Oncology

## 2021-10-14 ENCOUNTER — Inpatient Hospital Stay: Payer: Medicaid Other

## 2021-10-14 ENCOUNTER — Other Ambulatory Visit: Payer: Self-pay

## 2021-10-14 DIAGNOSIS — F1290 Cannabis use, unspecified, uncomplicated: Secondary | ICD-10-CM | POA: Diagnosis not present

## 2021-10-14 DIAGNOSIS — O9912 Other diseases of the blood and blood-forming organs and certain disorders involving the immune mechanism complicating childbirth: Secondary | ICD-10-CM | POA: Diagnosis not present

## 2021-10-14 DIAGNOSIS — O99112 Other diseases of the blood and blood-forming organs and certain disorders involving the immune mechanism complicating pregnancy, second trimester: Secondary | ICD-10-CM | POA: Diagnosis not present

## 2021-10-14 DIAGNOSIS — D509 Iron deficiency anemia, unspecified: Secondary | ICD-10-CM | POA: Diagnosis present

## 2021-10-14 DIAGNOSIS — D72829 Elevated white blood cell count, unspecified: Secondary | ICD-10-CM | POA: Diagnosis not present

## 2021-10-14 DIAGNOSIS — Z86718 Personal history of other venous thrombosis and embolism: Secondary | ICD-10-CM | POA: Diagnosis not present

## 2021-10-14 DIAGNOSIS — Z3A27 27 weeks gestation of pregnancy: Secondary | ICD-10-CM | POA: Diagnosis not present

## 2021-10-14 DIAGNOSIS — O99322 Drug use complicating pregnancy, second trimester: Secondary | ICD-10-CM | POA: Diagnosis not present

## 2021-10-14 DIAGNOSIS — E611 Iron deficiency: Secondary | ICD-10-CM

## 2021-10-14 DIAGNOSIS — O99012 Anemia complicating pregnancy, second trimester: Secondary | ICD-10-CM | POA: Diagnosis not present

## 2021-10-14 DIAGNOSIS — D75839 Thrombocytosis, unspecified: Secondary | ICD-10-CM | POA: Diagnosis not present

## 2021-10-14 LAB — CBC WITH DIFFERENTIAL/PLATELET
Abs Immature Granulocytes: 0.1 10*3/uL — ABNORMAL HIGH (ref 0.00–0.07)
Basophils Absolute: 0.1 10*3/uL (ref 0.0–0.1)
Basophils Relative: 1 %
Eosinophils Absolute: 0.2 10*3/uL (ref 0.0–0.5)
Eosinophils Relative: 2 %
HCT: 33.9 % — ABNORMAL LOW (ref 36.0–46.0)
Hemoglobin: 11.8 g/dL — ABNORMAL LOW (ref 12.0–15.0)
Immature Granulocytes: 1 %
Lymphocytes Relative: 26 %
Lymphs Abs: 3.1 10*3/uL (ref 0.7–4.0)
MCH: 31.5 pg (ref 26.0–34.0)
MCHC: 34.8 g/dL (ref 30.0–36.0)
MCV: 90.4 fL (ref 80.0–100.0)
Monocytes Absolute: 1.1 10*3/uL — ABNORMAL HIGH (ref 0.1–1.0)
Monocytes Relative: 10 %
Neutro Abs: 7.1 10*3/uL (ref 1.7–7.7)
Neutrophils Relative %: 60 %
Platelets: 359 10*3/uL (ref 150–400)
RBC: 3.75 MIL/uL — ABNORMAL LOW (ref 3.87–5.11)
RDW: 13.3 % (ref 11.5–15.5)
WBC: 11.8 10*3/uL — ABNORMAL HIGH (ref 4.0–10.5)
nRBC: 0 % (ref 0.0–0.2)

## 2021-10-14 LAB — IRON AND TIBC
Iron: 69 ug/dL (ref 28–170)
Saturation Ratios: 17 % (ref 10.4–31.8)
TIBC: 403 ug/dL (ref 250–450)
UIBC: 334 ug/dL

## 2021-10-14 LAB — FERRITIN: Ferritin: 33 ng/mL (ref 11–307)

## 2021-10-17 ENCOUNTER — Inpatient Hospital Stay (HOSPITAL_BASED_OUTPATIENT_CLINIC_OR_DEPARTMENT_OTHER): Payer: Medicaid Other | Admitting: Oncology

## 2021-10-17 ENCOUNTER — Encounter: Payer: Self-pay | Admitting: Oncology

## 2021-10-17 DIAGNOSIS — O9912 Other diseases of the blood and blood-forming organs and certain disorders involving the immune mechanism complicating childbirth: Secondary | ICD-10-CM

## 2021-10-17 DIAGNOSIS — E611 Iron deficiency: Secondary | ICD-10-CM

## 2021-10-17 DIAGNOSIS — Z3A27 27 weeks gestation of pregnancy: Secondary | ICD-10-CM

## 2021-10-17 DIAGNOSIS — D75839 Thrombocytosis, unspecified: Secondary | ICD-10-CM

## 2021-10-17 DIAGNOSIS — D509 Iron deficiency anemia, unspecified: Secondary | ICD-10-CM | POA: Diagnosis not present
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
## 2021-10-17 NOTE — Progress Notes (Signed)
Patient contacted for Mychart visit. Pt is currently [redacted] weeks pregnant, no new concerns voiced.

## 2022-10-30 ENCOUNTER — Other Ambulatory Visit: Payer: Medicaid Other
